# Patient Record
Sex: Male | Born: 1965 | Race: Black or African American | Hispanic: No | Marital: Single | Smoking: Never smoker
Health system: Southern US, Community
[De-identification: ages and names within clinical notes are randomized; demographics above are authoritative.]

## PROBLEM LIST (undated history)

## (undated) DIAGNOSIS — F909 Attention-deficit hyperactivity disorder, unspecified type: Secondary | ICD-10-CM

## (undated) DIAGNOSIS — H269 Unspecified cataract: Secondary | ICD-10-CM

## (undated) DIAGNOSIS — K819 Cholecystitis, unspecified: Secondary | ICD-10-CM

## (undated) DIAGNOSIS — E7800 Pure hypercholesterolemia, unspecified: Secondary | ICD-10-CM

## (undated) DIAGNOSIS — F79 Unspecified intellectual disabilities: Secondary | ICD-10-CM

## (undated) DIAGNOSIS — H409 Unspecified glaucoma: Secondary | ICD-10-CM

## (undated) DIAGNOSIS — R569 Unspecified convulsions: Secondary | ICD-10-CM

---

## 1997-10-31 ENCOUNTER — Other Ambulatory Visit: Admission: RE | Admit: 1997-10-31 | Discharge: 1997-10-31 | Payer: Self-pay | Admitting: Internal Medicine

## 1997-12-28 ENCOUNTER — Other Ambulatory Visit: Admission: RE | Admit: 1997-12-28 | Discharge: 1997-12-28 | Payer: Self-pay | Admitting: Internal Medicine

## 1998-01-23 ENCOUNTER — Other Ambulatory Visit: Admission: RE | Admit: 1998-01-23 | Discharge: 1998-01-23 | Payer: Self-pay | Admitting: Internal Medicine

## 2002-01-18 ENCOUNTER — Emergency Department (HOSPITAL_COMMUNITY): Admission: EM | Admit: 2002-01-18 | Discharge: 2002-01-18 | Payer: Self-pay | Admitting: Emergency Medicine

## 2004-05-22 ENCOUNTER — Emergency Department (HOSPITAL_COMMUNITY): Admission: EM | Admit: 2004-05-22 | Discharge: 2004-05-22 | Payer: Self-pay | Admitting: Emergency Medicine

## 2008-05-03 ENCOUNTER — Emergency Department (HOSPITAL_BASED_OUTPATIENT_CLINIC_OR_DEPARTMENT_OTHER): Admission: EM | Admit: 2008-05-03 | Discharge: 2008-05-03 | Payer: Self-pay | Admitting: Emergency Medicine

## 2011-06-29 ENCOUNTER — Emergency Department (INDEPENDENT_AMBULATORY_CARE_PROVIDER_SITE_OTHER): Payer: Medicare Other

## 2011-06-29 ENCOUNTER — Emergency Department (HOSPITAL_BASED_OUTPATIENT_CLINIC_OR_DEPARTMENT_OTHER)
Admission: EM | Admit: 2011-06-29 | Discharge: 2011-06-30 | Disposition: A | Discharge status: Home / Self Care | Payer: Medicare Other | Attending: Emergency Medicine | Admitting: Emergency Medicine

## 2011-06-29 DIAGNOSIS — R509 Fever, unspecified: Secondary | ICD-10-CM

## 2011-06-29 DIAGNOSIS — E119 Type 2 diabetes mellitus without complications: Secondary | ICD-10-CM | POA: Insufficient documentation

## 2011-06-29 DIAGNOSIS — R0989 Other specified symptoms and signs involving the circulatory and respiratory systems: Secondary | ICD-10-CM

## 2011-06-29 DIAGNOSIS — Z20828 Contact with and (suspected) exposure to other viral communicable diseases: Secondary | ICD-10-CM | POA: Insufficient documentation

## 2011-06-29 DIAGNOSIS — R51 Headache: Secondary | ICD-10-CM | POA: Insufficient documentation

## 2011-06-29 DIAGNOSIS — F79 Unspecified intellectual disabilities: Secondary | ICD-10-CM | POA: Insufficient documentation

## 2011-06-29 DIAGNOSIS — R05 Cough: Secondary | ICD-10-CM

## 2011-06-29 HISTORY — DX: Unspecified convulsions: R56.9

## 2011-06-29 HISTORY — DX: Unspecified intellectual disabilities: F79

## 2011-06-29 LAB — DIFFERENTIAL
Eosinophils Absolute: 0 10*3/uL (ref 0.0–0.7)
Eosinophils Relative: 0 % (ref 0–5)
Lymphocytes Relative: 17 % (ref 12–46)
Lymphs Abs: 1.3 10*3/uL (ref 0.7–4.0)
Monocytes Absolute: 1.7 10*3/uL — ABNORMAL HIGH (ref 0.1–1.0)
Monocytes Relative: 23 % — ABNORMAL HIGH (ref 3–12)
Neutro Abs: 4.3 10*3/uL (ref 1.7–7.7)
Neutrophils Relative %: 59 % (ref 43–77)

## 2011-06-29 LAB — CBC
HCT: 37.7 % — ABNORMAL LOW (ref 39.0–52.0)
Hemoglobin: 12.6 g/dL — ABNORMAL LOW (ref 13.0–17.0)
Platelets: 149 10*3/uL — ABNORMAL LOW (ref 150–400)
RDW: 12.8 % (ref 11.5–15.5)
WBC: 7.3 10*3/uL (ref 4.0–10.5)

## 2011-06-29 LAB — GLUCOSE, CAPILLARY: Glucose-Capillary: 69 mg/dL — ABNORMAL LOW (ref 70–99)

## 2011-06-29 MED ORDER — ACETAMINOPHEN 325 MG PO TABS: 650.0000 mg | ORAL_TABLET | Freq: Once | ORAL | Status: DC

## 2011-06-29 MED ORDER — SODIUM CHLORIDE 0.9 % IV BOLUS (SEPSIS)
1000.0000 mL | Freq: Once | INTRAVENOUS | Status: AC
  Administered 2011-06-29: 1000 mL via INTRAVENOUS

## 2011-06-29 MED ORDER — OSELTAMIVIR PHOSPHATE 75 MG PO CAPS
75.0000 mg | ORAL_CAPSULE | Freq: Once | ORAL | Status: AC
  Administered 2011-06-29: 75 mg via ORAL
  Filled 2011-06-29: qty 1

## 2011-06-29 NOTE — ED Provider Notes (Signed)
History  This chart was scribed for Omar Numbers, MD by Bennett Scrape. This patient was seen in room [facility]CT3/[facility]3 and the patient's care was started at 10:40PM.  CSN: [id number] Arrival date & time: 06/29/2011  9:05 PM   First MD Initiated Contact with Patient 06/29/11 2219      Chief Complaint  Patient presents with  . Fever    102.4  . Headache    The history is provided by a caregiver and the patient. No language interpreter was used.    Omar White is a 45 y.o. male brought in by caregiver from group home to the Emergency Department complaining of fever to 102, non-productive cough and mild headache. 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 Fever measured at 97.3 in the ED. Pt is from group home and is experiencing similar symptoms to other patients in the home.  One other resident is in the ED with fever as well while 2 of the residents at the home are already being treated with tamiflu. Caregiver states that he was given robitussin and tylenol for symptoms around 5:45PM with no improvement. He is a diabetic, but caregiver states that his sugars have been in a normal range. Caregiver confirms sick contacts at nursing home and reports two other residents being on tamiflu. Patient has normal SBP in the 90s per 2 caregivers' reports.   Past Medical History  Diagnosis Date  . MR (mental retardation)   . Seizures   . Diabetes mellitus     History reviewed. No pertinent past surgical history.  History reviewed. No pertinent family history.  History  Substance Use Topics  . Smoking status: Not on file  . Smokeless tobacco: Not on file  . Alcohol Use:       Review of Systems  Constitutional: Positive for fever. Negative for chills.  HENT: Negative for ear pain, sore throat and neck pain.   Eyes: Negative for pain and redness.  Respiratory: Positive for cough. Negative for shortness of breath.   Gastrointestinal: Negative for nausea, vomiting, abdominal pain  and diarrhea.  Genitourinary: Negative for dysuria, frequency and hematuria.  Musculoskeletal: Negative for back pain and joint swelling.  Skin: Negative for rash.  Neurological: Positive for headaches. Negative for dizziness and light-headedness.  All other systems reviewed and are negative.    Allergies  Shellfish allergy  Home Medications  No current outpatient prescriptions on file.  Triage Vitals: BP 102/61  Pulse 93  Temp(Src) 97.3 F (36.3 C) (Oral)  Resp 20  Wt 140 lb (63.504 kg)  SpO2 98%  Physical Exam  Nursing note and vitals reviewed. Constitutional: He is oriented to person, place, and time. He appears well-developed and well-nourished.       Uncomfortable appearing  HENT:  Head: Normocephalic and atraumatic.  Eyes: Conjunctivae and EOM are normal. Pupils are equal, round, and reactive to light.  Neck: Neck supple. No tracheal deviation present.  Cardiovascular: Normal rate, regular rhythm and normal heart sounds.  Exam reveals no gallop and no friction rub.   No murmur heard. Pulmonary/Chest: Effort normal and breath sounds normal. No respiratory distress. He has no wheezes. He has no rales.  Abdominal: Soft. Bowel sounds are normal. He exhibits no distension. There is no tenderness. There is no rebound and no guarding.  Musculoskeletal: Normal range of motion. He exhibits no edema and no tenderness.  Neurological: He is alert and oriented to person, place, and time. No cranial nerve deficit. He exhibits normal muscle  tone. Coordination normal.  Skin: Skin is warm and dry. No rash noted.  Psychiatric: He has a normal mood and affect.    ED Course  Procedures (including critical care time)  DIAGNOSTIC STUDIES: Oxygen Saturation is 98% on room air, normal by my interpretation.    COORDINATION OF CARE: 10:47PM-Discussed influenza swab, full blood work-up and tamiflu treatment with caregiver at bedside and caregiver agreed to plan. 11:25PM-Blood sugar was  69. Gave pt juice which he eagerly drank. 11:57PM-Discussed negative pneumonia chest x-ray and high WBC with caregiver at bedside and caregiver acknolwedged findings. Fever is 101.1 and will treat with Tylenol.   Labs Reviewed  CBC - Abnormal; Notable for the following:    RBC 4.09 (*)    Hemoglobin 12.6 (*)    HCT 37.7 (*)    Platelets 149 (*)    All other components within normal limits  DIFFERENTIAL - Abnormal; Notable for the following:    Monocytes Relative 23 (*)    Monocytes Absolute 1.7 (*)    All other components within normal limits  COMPREHENSIVE METABOLIC PANEL - Abnormal; Notable for the following:    Glucose, Bld 110 (*)    BUN 25 (*)    Creatinine, Ser 1.70 (*)    GFR calc non Af Amer 46 (*)    GFR calc Af Amer 54 (*)    All other components within normal limits  GLUCOSE, CAPILLARY - Abnormal; Notable for the following:    Glucose-Capillary 69 (*)    All other components within normal limits  LACTIC ACID, PLASMA  CULTURE, BLOOD (ROUTINE X 2)  PROCALCITONIN  POCT CBG MONITORING  INFLUENZA PANEL BY PCR   Dg Chest 2 View  06/29/2011  *RADIOLOGY REPORT*  Clinical Data: Cough, fever and congestion.  CHEST - 2 VIEW  Comparison: None.  Findings: The lungs are well-aerated and clear.  There is no evidence of focal opacification, pleural effusion or pneumothorax. Relatively symmetric biapical pleural thickening is noted.  The heart is normal in size; the mediastinal contour is within normal limits.  No acute osseous abnormalities are seen.  IMPRESSION: No acute cardiopulmonary process seen.  Original Report Authenticated By: Tonia Ghent, M.D.     1. Fever   2. Exposure to influenza       MDM  Patient was evaluated and had significant risk factors for influenza. Patient had had cough as well as fever. Fingerstick glucose was 69. Patient was able to drink and drank several cups of juice. Patient also had testing for influenza performed given that he lives in a group  home. Chest x-ray showed no signs of pneumonia. Renal panel showed no anion gap, a normal glucose, normal liver function, and a BUN of 25 with a creatinine of 1.7. No old creatinine was available for the patient. Nursing facility saffron aware of what his baseline was. Given patient's age as well as his history of diabetes he. Well could have a baseline creatinine of 1.7. Either way the patient is somewhat dehydrated. He was given IV fluids. With this patient reported feeling better.  Lactate was not elevated. Blood cultures were ordered initially in addition to urinalysis and urine culture. Patient had no leukocytosis and no symptoms concerning for urinary tract infection. Given that there was no anion gap on the patient's renal panel urinalysis was felt clinically necessary today. I think his symptoms are likely related to the viral syndrome displayed by his fellow residents. Patient did have flu testing sent for public health purposes.  He was treated with Tamiflu here. Patient did spike a temperature while in the emergency department. At that time patient was given Tylenol and was reassessed prior to departure. This had resolved the patient was feeling better. His discharge blood pressure was in the 90s systolic but this is his baseline per 2 staff members.  The patient was discharged in good condition with Tamiflu prescription.      I personally performed the services described in this documentation, which was scribed in my presence. The recorded information has been reviewed and considered.    Omar Numbers, MD 06/30/11 [phone]

## 2011-06-29 NOTE — ED Notes (Signed)
Pt presents with fever, cough and headache.  Caregiver states that he was given robitussin and tylenol for symptoms.

## 2011-06-30 LAB — INFLUENZA PANEL BY PCR (TYPE A & B): H1N1 flu by pcr: NOT DETECTED

## 2011-06-30 LAB — COMPREHENSIVE METABOLIC PANEL
ALT: 13 U/L (ref 0–53)
Alkaline Phosphatase: 79 U/L (ref 39–117)
CO2: 27 mEq/L (ref 19–32)
Calcium: 9.8 mg/dL (ref 8.4–10.5)
Chloride: 100 mEq/L (ref 96–112)
GFR calc Af Amer: 54 mL/min — ABNORMAL LOW (ref >90)
Total Bilirubin: 0.6 mg/dL (ref 0.3–1.2)
Total Protein: 8 g/dL (ref 6.0–8.3)

## 2011-06-30 LAB — PROCALCITONIN: Procalcitonin: <0.1 ng/mL

## 2011-06-30 MED ORDER — SODIUM CHLORIDE 0.9 % IV BOLUS (SEPSIS): 1000.0000 mL | Freq: Once | INTRAVENOUS | Status: DC

## 2011-06-30 MED ORDER — OSELTAMIVIR PHOSPHATE 75 MG PO CAPS: 75.0000 mg | ORAL_CAPSULE | Freq: Two times a day (BID) | ORAL | Status: AC

## 2011-06-30 MED ORDER — ACETAMINOPHEN 160 MG/5ML PO SOLN
650.0000 mg | Freq: Once | ORAL | Status: AC
  Administered 2011-06-30: 650 mg via ORAL

## 2011-06-30 MED ORDER — ACETAMINOPHEN 160 MG/5ML PO SOLN
ORAL | Status: AC
  Administered 2011-06-30: 650 mg via ORAL
  Filled 2011-06-30: qty 20.3

## 2011-07-06 LAB — CULTURE, BLOOD (ROUTINE X 2)
Culture  Setup Time: 201212030205
Culture: NO GROWTH

## 2011-07-28 ENCOUNTER — Encounter (HOSPITAL_BASED_OUTPATIENT_CLINIC_OR_DEPARTMENT_OTHER): Payer: Self-pay | Admitting: Emergency Medicine

## 2011-07-28 ENCOUNTER — Emergency Department (HOSPITAL_BASED_OUTPATIENT_CLINIC_OR_DEPARTMENT_OTHER)
Admission: EM | Admit: 2011-07-28 | Discharge: 2011-07-28 | Payer: Medicare Other | Attending: Emergency Medicine | Admitting: Emergency Medicine

## 2011-07-28 DIAGNOSIS — N498 Inflammatory disorders of other specified male genital organs: Secondary | ICD-10-CM | POA: Insufficient documentation

## 2011-07-28 DIAGNOSIS — N492 Inflammatory disorders of scrotum: Secondary | ICD-10-CM

## 2011-07-28 DIAGNOSIS — E119 Type 2 diabetes mellitus without complications: Secondary | ICD-10-CM | POA: Insufficient documentation

## 2011-07-28 DIAGNOSIS — E7800 Pure hypercholesterolemia, unspecified: Secondary | ICD-10-CM | POA: Insufficient documentation

## 2011-07-28 DIAGNOSIS — Z79899 Other long term (current) drug therapy: Secondary | ICD-10-CM | POA: Insufficient documentation

## 2011-07-28 HISTORY — DX: Pure hypercholesterolemia, unspecified: E78.00

## 2011-07-28 MED ORDER — SULFAMETHOXAZOLE-TRIMETHOPRIM 800-160 MG PO TABS: 1.0000 | ORAL_TABLET | Freq: Two times a day (BID) | ORAL | Status: AC

## 2011-07-28 MED ORDER — CLOTRIMAZOLE 1 % EX CREA: TOPICAL_CREAM | CUTANEOUS | Status: AC

## 2011-07-28 NOTE — ED Provider Notes (Signed)
History     CSN: [id number]  Arrival date & time 07/28/11  1326   First MD Initiated Contact with Patient 07/28/11 1407      Chief Complaint  Patient presents with  . Penis Pain    (Consider location/radiation/quality/duration/timing/severity/associated sxs/prior treatment) Patient is a 45 y.o. male presenting with penile pain. The history is provided by the patient. No language interpreter was used.  Penis Pain This is a new problem. The current episode started today. The problem occurs constantly. The problem has been unchanged. Pertinent negatives include no abdominal pain, fever or urinary symptoms. The symptoms are aggravated by nothing. He has tried nothing for the symptoms.  Pt has sores to right side of scrotum.  No known injury.  No fever.    Past Medical History  Diagnosis Date  . MR (mental retardation)   . Seizures   . Diabetes mellitus   . Glaucoma   . High cholesterol     No past surgical history on file.  No family history on file.  History  Substance Use Topics  . Smoking status: Not on file  . Smokeless tobacco: Not on file  . Alcohol Use:       Review of Systems  Constitutional: Negative for fever.  Gastrointestinal: Negative for abdominal pain.  Genitourinary: Positive for testicular pain.  All other systems reviewed and are negative.    Allergies  Shellfish allergy  Home Medications   Current Outpatient Rx  Name Route Sig Dispense Refill  . ACETAMINOPHEN 325 MG PO TABS Oral Take 650 mg by mouth every 6 (six) hours as needed. For pain and fever     . ARIPIPRAZOLE 20 MG PO TABS Oral Take 20 mg by mouth daily.      . GUAIFENESIN 100 MG/5ML PO LIQD Oral Take 300 mg by mouth 3 (three) times daily as needed. For cough     . LACTULOSE 10 GM/15ML PO SOLN Oral Take 10 g by mouth daily.      Marland Kitchen LAMOTRIGINE 100 MG PO TABS Oral Take 300 mg by mouth daily.     Marland Kitchen LISINOPRIL 2.5 MG PO TABS Oral Take 2.5 mg by mouth daily.     Marland Kitchen LORAZEPAM 1 MG PO  TABS Oral Take 1 mg by mouth daily.      Marland Kitchen METFORMIN HCL 500 MG PO TABS Oral Take 500 mg by mouth 2 (two) times daily with a meal.      . PROPRANOLOL HCL 80 MG PO TABS Oral Take 160 mg by mouth 2 (two) times daily.     Marland Kitchen RISPERIDONE 2 MG PO TABS Oral Take 2 mg by mouth 2 (two) times daily.      Marland Kitchen SIMVASTATIN 40 MG PO TABS Oral Take 40 mg by mouth at bedtime.        BP 116/69  Pulse 75  Temp(Src) 97.5 F (36.4 C) (Oral)  Resp 20  SpO2 99%  Physical Exam  Nursing note and vitals reviewed. Constitutional: He is oriented to person, place, and time. He appears well-developed and well-nourished.  HENT:  Head: Normocephalic.  Right Ear: External ear normal.  Left Ear: External ear normal.  Mouth/Throat: Oropharynx is clear and moist.  Eyes: Pupils are equal, round, and reactive to light.  Neck: Normal range of motion.  Cardiovascular: Normal rate.   Pulmonary/Chest: Effort normal and breath sounds normal.  Abdominal: Soft. Bowel sounds are normal.  Genitourinary: Penis normal.       Right testicle,  Wounds  look like pt has scratched with nails,  Red streaks,  Yellow exudate,    Musculoskeletal: Normal range of motion.  Neurological: He is alert and oriented to person, place, and time.  Skin: Skin is warm.  Psychiatric: He has a normal mood and affect.    ED Course  Procedures (including critical care time)  Labs Reviewed - No data to display No results found.   No diagnosis found.    MDM  I suspect pt has jock itch and has dug at scrotum with fingernails until he has torn skin.  I will treat with lotrimin and bactrim.   I advised pt needs to see Md for recheck in 2-3 days.  Group home staff advised to watch closely for worsening infection        Langston Masker, [place] 07/28/11 1432

## 2011-07-28 NOTE — ED Notes (Signed)
Supervisor from group home with pt reports staff noticed penile irritation on pt this am during bath

## 2011-07-29 NOTE — ED Provider Notes (Signed)
Medical screening examination/treatment/procedure(s) were performed by non-physician practitioner and as supervising physician I was immediately available for consultation/collaboration.   Geoffery Lyons, MD 07/29/11 [phone]

## 2013-06-15 ENCOUNTER — Ambulatory Visit: Payer: Self-pay | Admitting: Podiatry

## 2013-07-04 ENCOUNTER — Ambulatory Visit: Payer: Self-pay | Admitting: Podiatry

## 2013-07-18 ENCOUNTER — Ambulatory Visit (INDEPENDENT_AMBULATORY_CARE_PROVIDER_SITE_OTHER): Payer: Medicare Other | Admitting: Podiatry

## 2013-07-18 ENCOUNTER — Encounter: Payer: Self-pay | Admitting: Podiatry

## 2013-07-18 VITALS — BP 120/77 | HR 57 | Resp 14

## 2013-07-18 DIAGNOSIS — B351 Tinea unguium: Secondary | ICD-10-CM

## 2013-07-18 DIAGNOSIS — M79609 Pain in unspecified limb: Secondary | ICD-10-CM

## 2013-07-18 NOTE — Progress Notes (Signed)
Subjective:     Patient ID: Omar White, male   DOB: 1965-08-30, 47 y.o.   MRN: [id number]  HPI patient presents with caregiver with thick nail disease 1-5 both feet that are painful and impossible for him to take care of   Review of Systems     Objective:   Physical Exam Neurovascular status unchanged with nail disease 1-5 both feet that are thick and painful    Assessment:     Mycotic nail infection with pain 1-5 both feet    Plan:     Debridement of painful nailbeds 1-5 both feet with no bleeding noted

## 2014-12-12 ENCOUNTER — Ambulatory Visit (INDEPENDENT_AMBULATORY_CARE_PROVIDER_SITE_OTHER): Payer: Medicare Other | Admitting: Podiatry

## 2014-12-12 DIAGNOSIS — B351 Tinea unguium: Secondary | ICD-10-CM | POA: Diagnosis not present

## 2014-12-12 DIAGNOSIS — M79673 Pain in unspecified foot: Secondary | ICD-10-CM

## 2014-12-12 NOTE — Progress Notes (Signed)
   Subjective:    Patient ID: Omar PollockRoger Paul White, male    DOB: 09/03/1965, 49 y.o.   MRN: [id number]  HPI Comments: Pt presents with his caregiver for debridement of 10 toenails by Dr. Helane GuntherGregory Mayer.     Review of Systems HPI Presents today chief complaint of painful elongated toenails.  Objective: Pulses are palpable bilateral nails are thick, yellow dystrophic onychomycosis and painful palpation.   Assessment: Onychomycosis with pain in limb.  Plan: Treatment of nails in thickness and length as covered service secondary to pain.     Objective:   Physical Exam        Assessment & Plan:

## 2015-03-13 ENCOUNTER — Encounter: Payer: Self-pay | Admitting: Podiatry

## 2015-03-13 ENCOUNTER — Ambulatory Visit (INDEPENDENT_AMBULATORY_CARE_PROVIDER_SITE_OTHER): Payer: Medicare Other | Admitting: Podiatry

## 2015-03-13 DIAGNOSIS — M79673 Pain in unspecified foot: Secondary | ICD-10-CM | POA: Diagnosis not present

## 2015-03-13 DIAGNOSIS — B351 Tinea unguium: Secondary | ICD-10-CM

## 2015-03-13 NOTE — Progress Notes (Signed)
Patient ID: Omar White, male   DOB: [birth year], 49 y.o.   MRN: [id number] Complaint:  Visit Type: Patient returns to my office for continued preventative foot care services. Complaint: Patient states" my nails have grown long and thick and become painful to walk and wear shoes" Patient has been diagnosed with DM with no foot complications. The patient presents for preventative foot care services. No changes to ROS  Podiatric Exam: Vascular: dorsalis pedis and posterior tibial pulses are palpable bilateral. Capillary return is immediate. Temperature gradient is WNL. Skin turgor WNL  Sensorium: Normal Semmes Weinstein monofilament test. Normal tactile sensation bilaterally. Nail Exam: Pt has thick disfigured discolored nails with subungual debris noted bilateral entire nail hallux through fifth toenails Ulcer Exam: There is no evidence of ulcer or pre-ulcerative changes or infection. Orthopedic Exam: Muscle tone and strength are WNL. No limitations in general ROM. No crepitus or effusions noted. Foot type and digits show no abnormalities. Bony prominences are unremarkable. Skin: No Porokeratosis. No infection or ulcers  Diagnosis:  Onychomycosis, , Pain in right toe, pain in left toes  Treatment & Plan Procedures and Treatment: Consent by patient was obtained for treatment procedures. The patient understood the discussion of treatment and procedures well. All questions were answered thoroughly reviewed. Debridement of mycotic and hypertrophic toenails, 1 through 5 bilateral and clearing of subungual debris. No ulceration, no infection noted.  Return Visit-Office Procedure: Patient instructed to return to the office for a follow up visit 3 months for continued evaluation and treatment.

## 2015-06-26 ENCOUNTER — Encounter: Payer: Self-pay | Admitting: Podiatry

## 2015-06-26 ENCOUNTER — Ambulatory Visit (INDEPENDENT_AMBULATORY_CARE_PROVIDER_SITE_OTHER): Payer: Medicare Other | Admitting: Podiatry

## 2015-06-26 DIAGNOSIS — B351 Tinea unguium: Secondary | ICD-10-CM | POA: Diagnosis not present

## 2015-06-26 DIAGNOSIS — M79673 Pain in unspecified foot: Secondary | ICD-10-CM

## 2015-06-26 NOTE — Progress Notes (Signed)
Patient ID: Omar PollockRoger Paul White, male   DOB: [birth year], 49 y.o.   MRN: [id number] Complaint:  Visit Type: Patient returns to my office for continued preventative foot care services. Complaint: Patient states" my nails have grown long and thick and become painful to walk and wear shoes" Patient has been diagnosed with DM with no foot complications. The patient presents for preventative foot care services. No changes to ROS  Podiatric Exam: Vascular: dorsalis pedis and posterior tibial pulses are palpable bilateral. Capillary return is immediate. Temperature gradient is WNL. Skin turgor WNL  Sensorium: Normal Semmes Weinstein monofilament test. Normal tactile sensation bilaterally. Nail Exam: Pt has thick disfigured discolored nails with subungual debris noted bilateral entire nail hallux through fifth toenails Ulcer Exam: There is no evidence of ulcer or pre-ulcerative changes or infection. Orthopedic Exam: Muscle tone and strength are WNL. No limitations in general ROM. No crepitus or effusions noted. Foot type and digits show no abnormalities. Bony prominences are unremarkable. Skin: No Porokeratosis. No infection or ulcers  Diagnosis:  Onychomycosis, , Pain in right toe, pain in left toes  Treatment & Plan Procedures and Treatment: Consent by patient was obtained for treatment procedures. The patient understood the discussion of treatment and procedures well. All questions were answered thoroughly reviewed. Debridement of mycotic and hypertrophic toenails, 1 through 5 bilateral and clearing of subungual debris. No ulceration, no infection noted.  Return Visit-Office Procedure: Patient instructed to return to the office for a follow up visit 3 months for continued evaluation and treatment.  Helane GuntherGregory Radin Raptis DPM

## 2015-10-02 ENCOUNTER — Ambulatory Visit: Payer: Medicare Other | Admitting: Podiatry

## 2015-10-05 ENCOUNTER — Ambulatory Visit: Payer: Medicare Other | Admitting: Podiatry

## 2016-09-22 ENCOUNTER — Emergency Department (HOSPITAL_COMMUNITY): Payer: Medicare Other

## 2016-09-22 ENCOUNTER — Encounter (HOSPITAL_COMMUNITY): Payer: Self-pay | Admitting: Emergency Medicine

## 2016-09-22 ENCOUNTER — Emergency Department (HOSPITAL_COMMUNITY)
Admission: EM | Admit: 2016-09-22 | Discharge: 2016-09-22 | Disposition: A | Discharge status: Home / Self Care | Payer: Medicare Other | Attending: Emergency Medicine | Admitting: Emergency Medicine

## 2016-09-22 DIAGNOSIS — E119 Type 2 diabetes mellitus without complications: Secondary | ICD-10-CM | POA: Insufficient documentation

## 2016-09-22 DIAGNOSIS — K5900 Constipation, unspecified: Secondary | ICD-10-CM | POA: Diagnosis not present

## 2016-09-22 DIAGNOSIS — Z7984 Long term (current) use of oral hypoglycemic drugs: Secondary | ICD-10-CM | POA: Diagnosis not present

## 2016-09-22 DIAGNOSIS — R1013 Epigastric pain: Secondary | ICD-10-CM | POA: Diagnosis present

## 2016-09-22 LAB — COMPREHENSIVE METABOLIC PANEL WITH GFR
ALT: 28 U/L (ref 17–63)
AST: 43 U/L — ABNORMAL HIGH (ref 15–41)
Albumin: 4.8 g/dL (ref 3.5–5.0)
Alkaline Phosphatase: 97 U/L (ref 38–126)
Anion gap: 7 (ref 5–15)
BUN: 28 mg/dL — ABNORMAL HIGH (ref 6–20)
CO2: 30 mmol/L (ref 22–32)
Calcium: 10.7 mg/dL — ABNORMAL HIGH (ref 8.9–10.3)
Chloride: 100 mmol/L — ABNORMAL LOW (ref 101–111)
Creatinine, Ser: 1.62 mg/dL — ABNORMAL HIGH (ref 0.61–1.24)
GFR calc Af Amer: 55 mL/min — ABNORMAL LOW
GFR calc non Af Amer: 47 mL/min — ABNORMAL LOW
Glucose, Bld: 103 mg/dL — ABNORMAL HIGH (ref 65–99)
Potassium: 4.6 mmol/L (ref 3.5–5.1)
Sodium: 137 mmol/L (ref 135–145)
Total Bilirubin: 0.5 mg/dL (ref 0.3–1.2)
Total Protein: 9 g/dL — ABNORMAL HIGH (ref 6.5–8.1)

## 2016-09-22 LAB — CBC
HCT: 37.7 % — ABNORMAL LOW (ref 39.0–52.0)
HEMOGLOBIN: 12.9 g/dL — AB (ref 13.0–17.0)
MCH: 31 pg (ref 26.0–34.0)
MCHC: 34.2 g/dL (ref 30.0–36.0)
MCV: 90.6 fL (ref 78.0–100.0)
Platelets: 224 10*3/uL (ref 150–400)
RBC: 4.16 MIL/uL — ABNORMAL LOW (ref 4.22–5.81)
RDW: 12.8 % (ref 11.5–15.5)
WBC: 5.9 10*3/uL (ref 4.0–10.5)

## 2016-09-22 LAB — I-STAT TROPONIN, ED: Troponin i, poc: 0 ng/mL (ref 0.00–0.08)

## 2016-09-22 LAB — LIPASE, BLOOD: Lipase: 25 U/L (ref 11–51)

## 2016-09-22 MED ORDER — ONDANSETRON 4 MG PO TBDP
4.0000 mg | ORAL_TABLET | Freq: Once | ORAL | Status: AC
  Administered 2016-09-22: 4 mg via ORAL
  Filled 2016-09-22: qty 1

## 2016-09-22 MED ORDER — SODIUM CHLORIDE 0.9 % IV BOLUS (SEPSIS)
1000.0000 mL | Freq: Once | INTRAVENOUS | Status: AC
  Administered 2016-09-22: 1000 mL via INTRAVENOUS

## 2016-09-22 MED ORDER — MILK AND MOLASSES ENEMA
1.0000 | Freq: Once | RECTAL | Status: AC
  Administered 2016-09-22: 250 mL via RECTAL
  Filled 2016-09-22: qty 250

## 2016-09-22 MED ORDER — IOPAMIDOL (ISOVUE-300) INJECTION 61%
100.0000 mL | Freq: Once | INTRAVENOUS | Status: AC | PRN
  Administered 2016-09-22: 75 mL via INTRAVENOUS

## 2016-09-22 MED ORDER — IOPAMIDOL (ISOVUE-300) INJECTION 61%
INTRAVENOUS | Status: AC
  Filled 2016-09-22: qty 100

## 2016-09-22 NOTE — ED Notes (Signed)
Pt tolerated crackers PO 

## 2016-09-22 NOTE — ED Triage Notes (Signed)
Pt began having n/v/d last night. Nausea continues today. When asked about pain, pt reports he has CP and upper back pain. No SOB.

## 2016-09-22 NOTE — Discharge Instructions (Signed)
One cap of Miralax mixed in a glass of water daily until bowel movements are regular. An over the counter stool softener such as Colace can also be used.  Follow up with your primary physician in regard's to today's visit. Return to ER for persistent vomiting, new or worsening symptoms, any additional concerns.   GETTING TO GOOD BOWEL HEALTH.     The goal: ONE SOFT BOWEL MOVEMENT A DAY!  To have soft, regular bowel movements:  Drink at least 8 tall glasses of water a day.   Take plenty of fiber.  Fiber is the undigested part of plant food that passes into the colon, acting s ?natures broom? to encourage bowel motility and movement.  Fiber can absorb and hold large amounts of water. This results in a larger, bulkier stool, which is soft and easier to pass. Work gradually over several weeks up to 6 servings a day of fiber (25g a day even more if needed) in the form of: Vegetables -- Root (potatoes, carrots, turnips), leafy green (lettuce, salad greens, celery, spinach), or cooked high residue (cabbage, broccoli, etc) Fruit -- Fresh (unpeeled skin & pulp), Dried (prunes, apricots, cherries, etc ),  or stewed ( applesauce)  Whole grain breads, pasta, etc (whole wheat)  Bran cereals

## 2016-09-22 NOTE — ED Provider Notes (Signed)
[facility]-EMERGENCY DEPT Provider Note   CSN: [id number] Arrival date & time: 09/22/16  1536     History   Chief Complaint Chief Complaint  Omar White presents with  . Emesis  . Nausea  . Chest Pain    HPI Omar White is a 51 y.o. male.  The history is provided by the Omar White, medical records and a caregiver. No language interpreter was used.  Emesis   Pertinent negatives include no fever.  Chest Pain   Associated symptoms include vomiting. Pertinent negatives include no fever.   Omar White is a 51 y.o. male  with a PMH of MR, DM, HLD, seizures who presents to the Emergency Department with caregiver for nausea and not acting himself last night. Per caregiver, Omar White had 3 episodes of emesis today and does not want to eat or drink as usual. No diarrhea. Omar White unsure of last BM. Caregiver states that she was not with him over the weekend but know that Omar White didn't have a BM today. No fevers/chills. When asked where pain is, Omar White will point to epigastrium and say "my chest".   Level V caveat applies 2/2 mental retardation.   Past Medical History:  Diagnosis Date  . Diabetes mellitus   . Glaucoma   . High cholesterol   . MR (mental retardation)   . Seizures (HCC)     There are no active problems to display for this Omar White.   History reviewed. No pertinent surgical history.     Home Medications    Prior to Admission medications   Medication Sig Start Date End Date Taking? Authorizing Provider  ARIPiprazole (ABILIFY) 15 MG tablet Take 15 mg by mouth daily. Daily in morning   Yes Historical Provider, MD  benztropine (COGENTIN) 1 MG tablet Take 1 mg by mouth 2 (two) times daily.   Yes Historical Provider, MD  clotrimazole (LOTRIMIN) 1 % cream Apply 1 application topically 2 (two) times daily.   Yes Historical Provider, MD  docusate sodium (DOCQLACE) 100 MG capsule Take 100 mg by mouth 2 (two) times daily.   Yes Historical Provider, MD  Emollient (LUBRISKIN EX) Apply  topically.   Yes Historical Provider, MD  guaiFENesin (ROBITUSSIN) 100 MG/5ML liquid Take 300 mg by mouth 3 (three) times daily as needed. For cough    Yes Historical Provider, MD  ketoconazole (NIZORAL) 2 % cream Apply 1 application topically daily.   Yes Historical Provider, MD  lactulose (CHRONULAC) 10 GM/15ML solution Take 10 g by mouth daily.     Yes Historical Provider, MD  lamoTRIgine (LAMICTAL) 100 MG tablet Take 100 mg by mouth 2 (two) times daily.    Yes Historical Provider, MD  levOCARNitine (CARNITOR) 330 MG tablet Take 330 mg by mouth 3 (three) times daily.   Yes Historical Provider, MD  lisinopril (PRINIVIL,ZESTRIL) 2.5 MG tablet Take 2.5 mg by mouth daily.    Yes Historical Provider, MD  LORazepam (ATIVAN) 1 MG tablet Take 1 mg by mouth daily.     Yes Historical Provider, MD  metFORMIN (GLUCOPHAGE) 500 MG tablet Take 500 mg by mouth 2 (two) times daily with a meal.     Yes Historical Provider, MD  mupirocin ointment (BACTROBAN) 2 % Place 1 application into the nose 2 (two) times daily.   Yes Historical Provider, MD  propranolol (INDERAL) 60 MG tablet Take 60 mg by mouth daily. mornings   Yes Historical Provider, MD  propranolol (INDERAL) 80 MG tablet Take 80 mg by mouth at bedtime.  Yes Historical Provider, MD  risperiDONE (RISPERDAL) 2 MG tablet Take 2 mg by mouth 2 (two) times daily.     Yes Historical Provider, MD  simvastatin (ZOCOR) 20 MG tablet Take 20 mg by mouth daily.   Yes Historical Provider, MD  simvastatin (ZOCOR) 40 MG tablet Take 40 mg by mouth at bedtime.     Yes Historical Provider, MD    Family History History reviewed. No pertinent family history.  Social History Social History  Substance Use Topics  . Smoking status: Unknown If Ever Smoked  . Smokeless tobacco: Not on file  . Alcohol use Not on file     Allergies   Shellfish allergy   Review of Systems Review of Systems  Unable to perform ROS: Other (MR)  Constitutional: Negative for fever.    Gastrointestinal: Positive for vomiting.     Physical Exam Updated Vital Signs BP 118/79 (BP Location: Right Arm)   Pulse 79   Temp 97 F (36.1 C) (Axillary)   Resp 18   SpO2 100%   Physical Exam  Constitutional: Omar White is oriented to person, place, and time. Omar White appears well-developed and well-nourished. No distress.  Non-toxic appearing.  HENT:  Head: Normocephalic and atraumatic.  Cardiovascular: Normal rate, regular rhythm and normal heart sounds.   No murmur heard. Pulmonary/Chest: Effort normal and breath sounds normal. No respiratory distress. Omar White has no wheezes. Omar White has no rales.  Abdominal: Soft. Bowel sounds are normal. Omar White exhibits no distension.  Generalized tenderness to palpation with no focality. No rebound or guarding.   Musculoskeletal:  Moves all extremities well.   Neurological: Omar White is alert and oriented to person, place, and time.  Skin: Skin is warm and dry.  Nursing note and vitals reviewed.    ED Treatments / Results  Labs (all labs ordered are listed, but only abnormal results are displayed) Labs Reviewed  COMPREHENSIVE METABOLIC PANEL - Abnormal; Notable for the following:       Result Value   Chloride 100 (*)    Glucose, Bld 103 (*)    BUN 28 (*)    Creatinine, Ser 1.62 (*)    Calcium 10.7 (*)    Total Protein 9.0 (*)    AST 43 (*)    GFR calc non Af Amer 47 (*)    GFR calc Af Amer 55 (*)    All other components within normal limits  CBC - Abnormal; Notable for the following:    RBC 4.16 (*)    Hemoglobin 12.9 (*)    HCT 37.7 (*)    All other components within normal limits  LIPASE, BLOOD  URINALYSIS, ROUTINE W REFLEX MICROSCOPIC  I-STAT TROPOININ, ED    EKG  EKG Interpretation  Date/Time:  Monday September 22 2016 16:09:49 EST Ventricular Rate:  72 PR Interval:    QRS Duration: 80 QT Interval:  336 QTC Calculation: 368 R Axis:   54 Text Interpretation:  Sinus rhythm Probable left atrial enlargement RSR' in V1 or V2, probably normal  variant ST elev, probable normal early repol pattern Baseline wander in lead(s) V3 No old tracing to compare Confirmed by [facility] MD, JULIE [phone]) on 09/22/2016 7:33:54 PM       Radiology Dg Chest 2 View  Result Date: 09/22/2016 CLINICAL DATA:  Vomiting and chest pain for the past day. History of diabetes, seizure activity, hyperlipidemia. EXAM: CHEST  2 VIEW COMPARISON:  Chest x-ray of June 29, 2011 FINDINGS: The lungs are adequately inflated and clear. The heart  and pulmonary vascularity are normal. The mediastinum is normal in width. There is no pleural effusion. The bony thorax exhibits no acute abnormality. The bowel gas pattern in the upper abdomen is unremarkable. IMPRESSION: There is no pneumonia nor other acute cardiopulmonary abnormality. Electronically Signed   By: David  [place] M.D.   On: 09/22/2016 16:27   Ct Abdomen Pelvis W Contrast  Result Date: 09/22/2016 CLINICAL DATA:  Abdominal pain.  Nausea, vomiting and diarrhea. EXAM: CT ABDOMEN AND PELVIS WITH CONTRAST TECHNIQUE: Multidetector CT imaging of the abdomen and pelvis was performed using the standard protocol following bolus administration of intravenous contrast. CONTRAST:  75mL ISOVUE-300 IOPAMIDOL (ISOVUE-300) INJECTION 61% COMPARISON:  None. FINDINGS: Lower chest: Trace left pleural effusion. Minimal dependent atelectasis. Heart is normal in size. Hepatobiliary: Tiny subcentimeter hypodensity in the right lobe of the liver is too small to characterize. Calcified gallstones within primarily decompressed gallbladder. Gallstone also noted in the gallbladder neck. No gallbladder wall thickening or pericholecystic inflammation. No biliary dilatation. Pancreas: No ductal dilatation or inflammation. Spleen: Normal in size without focal abnormality. Adrenals/Urinary Tract: Normal adrenal glands. Symmetric renal enhancement and excretion on delayed phase imaging. There is a 2.4 cm simple cyst in the mid right kidney. Urinary bladder is  physiologically distended without wall thickening. Stomach/Bowel: Large stool burden in the transverse, descending, and sigmoid colon with a stool ball distending the rectum. Sigmoid colonic tortuosity. Cecum and ascending colon are prominent fluid-filled. No colonic inflammation. Fluid-filled small bowel in the right lower abdomen. The appendix is not confidently visualized. There is no pericecal or right lower quadrant inflammation to suggest appendicitis. No evidence of obstruction. Stomach is decompressed and not well evaluated. Vascular/Lymphatic: No significant vascular findings are present. Accessory renal artery on the left, incidentally noted. No enlarged abdominal or pelvic lymph nodes. Reproductive: Prostate is unremarkable. Other: No free air, free fluid, or intra-abdominal fluid collection. Musculoskeletal: There are no acute or suspicious osseous abnormalities. Degenerative spurring in the spine. IMPRESSION: 1. Large colonic stool burden suggesting constipation, with stool distending the rectum. Fluid-filled distal small bowel and ascending colon, pattern consistent with ileus or less likely enteritis. No evidence of bowel obstruction. 2. Cholelithiasis with gallstone in the gallbladder neck. No gallbladder inflammation or abnormal distention. 3. Trace left pleural effusion. Electronically Signed   By: Rubye Oaks M.D.   On: 09/22/2016 20:47    Procedures Procedures (including critical care time)  Medications Ordered in ED Medications  iopamidol (ISOVUE-300) 61 % injection (not administered)  ondansetron (ZOFRAN-ODT) disintegrating tablet 4 mg (4 mg Oral Given 09/22/16 1926)  sodium chloride 0.9 % bolus 1,000 mL (1,000 mLs Intravenous New Bag/Given 09/22/16 2012)  iopamidol (ISOVUE-300) 61 % injection 100 mL (75 mLs Intravenous Contrast Given 09/22/16 2020)  milk and molasses enema (250 mLs Rectal Given 09/22/16 2224)     Initial Impression / Assessment and Plan / ED Course  I have  reviewed the triage vital signs and the nursing notes.  Pertinent labs & imaging results that were available during my care of the Omar White were reviewed by me and considered in my medical decision making (see chart for details).    Wentworth Yuvin Bussiere is a 51 y.o. male with hx of MR who presents to ED for n/v and abdominal pain. When asked where pain is, Omar White will point to abdomen, but say his chest hurts. Troponin, cxr and EKG were obtained to evaluate for possible chest pain all of which were reassuring. Labs at baseline. CT abd/pelvis obtained which shows:  IMPRESSION: 1. Large colonic stool burden suggesting constipation, with stool distending the rectum. Fluid-filled distal small bowel and ascending colon, pattern consistent with ileus or less likely enteritis. No evidence of bowel obstruction. 2. Cholelithiasis with gallstone in the gallbladder neck. No gallbladder inflammation or abnormal distention. 3. Trace left pleural effusion.  Enema given which produced very large bowel movement. On repeat evaluation, Omar White appears to feel improved. Abdominal exam improved. No emesis while in ED today and tolerating soda and crackers without difficulty. Discussed increase hydration, miralax regimen and PCP follow up for recheck with caregiver who is agreeable with plan and feels comfortable with discharge to home.   Omar White discussed with Dr. Particia NearingHaviland who agrees with treatment plan.    Final Clinical Impressions(s) / ED Diagnoses   Final diagnoses:  Constipation, unspecified constipation type    New Prescriptions New Prescriptions   No medications on file     [facility] LLCJaime Pilcher Ward, PA-C 09/22/16 2333    Jacalyn LefevreJulie Haviland, MD 09/23/16 [phone]

## 2017-06-13 ENCOUNTER — Other Ambulatory Visit: Payer: Self-pay

## 2017-06-13 ENCOUNTER — Emergency Department (HOSPITAL_COMMUNITY)
Admission: EM | Admit: 2017-06-13 | Discharge: 2017-06-13 | Disposition: A | Discharge status: Home / Self Care | Payer: Medicare Other | Attending: Emergency Medicine | Admitting: Emergency Medicine

## 2017-06-13 ENCOUNTER — Emergency Department (HOSPITAL_COMMUNITY): Payer: Medicare Other

## 2017-06-13 ENCOUNTER — Encounter (HOSPITAL_COMMUNITY): Payer: Self-pay | Admitting: Emergency Medicine

## 2017-06-13 DIAGNOSIS — Z79899 Other long term (current) drug therapy: Secondary | ICD-10-CM | POA: Insufficient documentation

## 2017-06-13 DIAGNOSIS — S59911A Unspecified injury of right forearm, initial encounter: Secondary | ICD-10-CM | POA: Diagnosis present

## 2017-06-13 DIAGNOSIS — Z7984 Long term (current) use of oral hypoglycemic drugs: Secondary | ICD-10-CM | POA: Diagnosis not present

## 2017-06-13 DIAGNOSIS — W2209XA Striking against other stationary object, initial encounter: Secondary | ICD-10-CM | POA: Diagnosis not present

## 2017-06-13 DIAGNOSIS — S52291A Other fracture of shaft of right ulna, initial encounter for closed fracture: Secondary | ICD-10-CM | POA: Diagnosis not present

## 2017-06-13 DIAGNOSIS — Y9389 Activity, other specified: Secondary | ICD-10-CM | POA: Diagnosis not present

## 2017-06-13 DIAGNOSIS — Y998 Other external cause status: Secondary | ICD-10-CM | POA: Diagnosis not present

## 2017-06-13 DIAGNOSIS — E119 Type 2 diabetes mellitus without complications: Secondary | ICD-10-CM | POA: Insufficient documentation

## 2017-06-13 DIAGNOSIS — F79 Unspecified intellectual disabilities: Secondary | ICD-10-CM | POA: Insufficient documentation

## 2017-06-13 DIAGNOSIS — Y9289 Other specified places as the place of occurrence of the external cause: Secondary | ICD-10-CM | POA: Insufficient documentation
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
## 2017-06-13 NOTE — Discharge Instructions (Signed)
It was my pleasure taking care of you today!   You should receive a call from the hand surgeon's office this week to arrange follow up. If you do not receive a call by Wednesday, please call the office to schedule an appointment.   Do not get splint wet.   Tylenol as needed for pain.   Return to ER for new or worsening symptoms, any additional concerns.
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
## 2018-09-10 ENCOUNTER — Emergency Department (HOSPITAL_COMMUNITY)
Admission: EM | Admit: 2018-09-10 | Discharge: 2018-09-11 | Disposition: A | Discharge status: Home / Self Care | Payer: Medicare Other | Attending: Emergency Medicine | Admitting: Emergency Medicine

## 2018-09-10 ENCOUNTER — Encounter (HOSPITAL_COMMUNITY): Payer: Self-pay

## 2018-09-10 ENCOUNTER — Emergency Department (HOSPITAL_COMMUNITY): Payer: Medicare Other

## 2018-09-10 DIAGNOSIS — Z79899 Other long term (current) drug therapy: Secondary | ICD-10-CM | POA: Diagnosis not present

## 2018-09-10 DIAGNOSIS — E119 Type 2 diabetes mellitus without complications: Secondary | ICD-10-CM | POA: Insufficient documentation

## 2018-09-10 DIAGNOSIS — J101 Influenza due to other identified influenza virus with other respiratory manifestations: Secondary | ICD-10-CM | POA: Diagnosis not present

## 2018-09-10 DIAGNOSIS — Z7984 Long term (current) use of oral hypoglycemic drugs: Secondary | ICD-10-CM | POA: Insufficient documentation

## 2018-09-10 DIAGNOSIS — R05 Cough: Secondary | ICD-10-CM | POA: Diagnosis present

## 2018-09-10 LAB — INFLUENZA PANEL BY PCR (TYPE A & B)
Influenza A By PCR: POSITIVE — AB
Influenza B By PCR: NEGATIVE

## 2018-09-10 NOTE — ED Notes (Signed)
Pt returned from X Ray.

## 2018-09-10 NOTE — ED Triage Notes (Signed)
Pt sent by group home requesting a flu test, pt having fever today, congestion and body aches

## 2018-09-10 NOTE — ED Provider Notes (Signed)
[facility] EMERGENCY DEPARTMENT Provider Note   CSN: [id number] Arrival date & time: 09/10/18  1909     History   Chief Complaint Chief Complaint  Patient presents with  . Influenza    HPI Omar White is a 53 y.o. male with a hx of insulin-dependent diabetes, glaucoma, high cholesterol, developmental delay, seizures presents to the Emergency Department from a group home with complaints of URI symptoms.  Patient is unable to provide any history.  Level 5 Caveat for developmental delay.  Discussed with Molly Maduro.  [place].  He reports that several of the nurses stated that Renae Fickle had complaints of being achy, with headache, cough, and ST starting this morning.  Patient had a measured fever of 102 at the group home this afternoon.  Molly Maduro reports that several of the group home members have had influenza.  He reports that the patient was sent here for testing and treatment for influenza.  Molly Maduro reports that the nurses did not report any SOB, vomiting, altered mental status, syncope, ureters or other symptoms.    The history is provided by the patient and medical records. No language interpreter was used.    Past Medical History:  Diagnosis Date  . Diabetes mellitus   . Glaucoma   . High cholesterol   . MR (mental retardation)   . Seizures (HCC)     There are no active problems to display for this patient.   History reviewed. No pertinent surgical history.      Home Medications    Prior to Admission medications   Medication Sig Start Date End Date Taking? Authorizing Provider  amphetamine-dextroamphetamine (ADDERALL XR) 20 MG 24 hr capsule Take 20 mg daily by mouth.    [provider]  ARIPiprazole (ABILIFY) 15 MG tablet Take 15 mg by mouth daily. Daily in morning    [provider]  benztropine (COGENTIN) 1 MG tablet Take 1 mg by mouth 2 (two) times daily.    [provider]  clotrimazole (LOTRIMIN) 1 % cream  Apply 1 application topically 2 (two) times daily.    [provider]  docusate sodium (DOCQLACE) 100 MG capsule Take 100 mg by mouth 2 (two) times daily.    [provider]  Emollient (LUBRISKIN EX) Apply topically.    [provider]  guaiFENesin (ROBITUSSIN) 100 MG/5ML liquid Take 300 mg by mouth 3 (three) times daily as needed. For cough     [provider]  ketoconazole (NIZORAL) 2 % cream Apply 1 application topically daily.    [provider]  lactulose (CHRONULAC) 10 GM/15ML solution Take 10 g by mouth daily.      [provider]  lamoTRIgine (LAMICTAL) 100 MG tablet Take 100 mg by mouth 2 (two) times daily.     [provider]  levOCARNitine (CARNITOR) 330 MG tablet Take 330 mg by mouth 3 (three) times daily.    [provider]  lisinopril (PRINIVIL,ZESTRIL) 2.5 MG tablet Take 2.5 mg by mouth daily.     [provider]  LORazepam (ATIVAN) 1 MG tablet Take 1 mg by mouth daily.      [provider]  metFORMIN (GLUCOPHAGE) 500 MG tablet Take 500 mg by mouth 2 (two) times daily with a meal.      [provider]  mupirocin ointment (BACTROBAN) 2 % Place 1 application into the nose 2 (two) times daily.    [provider]  oseltamivir (TAMIFLU) 75 MG capsule Take  1 capsule (75 mg total) by mouth every 12 (twelve) hours. 09/11/18   Ehtan Delfavero, Dahlia Client, PA-C  propranolol (INDERAL) 60 MG tablet Take 60 mg by mouth daily. mornings    [provider]  propranolol (INDERAL) 80 MG tablet Take 80 mg by mouth at bedtime.     [provider]  risperiDONE (RISPERDAL) 2 MG tablet Take 2 mg by mouth 2 (two) times daily.      [provider]  simvastatin (ZOCOR) 20 MG tablet Take 20 mg by mouth daily.    [provider]    Family History No family history on file.  Social History Social History   Tobacco Use  . Smoking status: Never Smoker  . Smokeless  tobacco: Never Used  Substance Use Topics  . Alcohol use: No    Frequency: Never  . Drug use: No     Allergies   Shellfish allergy and Tegretol [carbamazepine]   Review of Systems Review of Systems  Unable to perform ROS: Other     Physical Exam Updated Vital Signs BP 132/81   Pulse 95   Temp 99.3 F (37.4 C) (Oral)   Resp 20   SpO2 100%   Physical Exam Vitals signs and nursing note reviewed.  Constitutional:      General: He is not in acute distress.    Appearance: He is well-developed. He is not diaphoretic.  HENT:     Head: Normocephalic and atraumatic.     Right Ear: Tympanic membrane, ear canal and external ear normal.     Left Ear: Tympanic membrane, ear canal and external ear normal.     Nose: Mucosal edema and rhinorrhea present.     Right Sinus: No maxillary sinus tenderness or frontal sinus tenderness.     Left Sinus: No maxillary sinus tenderness or frontal sinus tenderness.     Mouth/Throat:     Mouth: Mucous membranes are not pale and not cyanotic.     Pharynx: Uvula midline. No oropharyngeal exudate or posterior oropharyngeal erythema.     Tonsils: No tonsillar abscesses.  Eyes:     Conjunctiva/sclera: Conjunctivae normal.     Pupils: Pupils are equal, round, and reactive to light.  Neck:     Musculoskeletal: Full passive range of motion without pain and normal range of motion.  Cardiovascular:     Rate and Rhythm: Normal rate.  Pulmonary:     Effort: Pulmonary effort is normal.     Breath sounds: No stridor. Rhonchi ( throughout) present.  Abdominal:     Palpations: Abdomen is soft.     Tenderness: There is no abdominal tenderness.  Musculoskeletal: Normal range of motion.  Lymphadenopathy:     Cervical: No cervical adenopathy.  Skin:    General: Skin is warm and dry.     Findings: No rash.  Neurological:     Mental Status: He is alert.      ED Treatments / Results  Labs (all labs ordered are listed, but only abnormal results are  displayed) Labs Reviewed  INFLUENZA PANEL BY PCR (TYPE A & B) - Abnormal; Notable for the following components:      Result Value   Influenza A By PCR POSITIVE (*)    All other components within normal limits    Radiology Dg Chest 2 View  Result Date: 09/11/2018 CLINICAL DATA:  Fever with congestion and body aches. EXAM: CHEST - 2 VIEW COMPARISON:  09/10/2018 FINDINGS: Lungs are adequately inflated without focal airspace consolidation or  effusion. Cardiomediastinal silhouette and remainder of the exam is unchanged. IMPRESSION: No active cardiopulmonary disease. Electronically Signed   By: Elberta Fortisaniel  Boyle M.D.   On: 09/11/2018 01:12    Procedures Procedures (including critical care time)  Medications Ordered in ED Medications - No data to display   Initial Impression / Assessment and Plan / ED Course  I have reviewed the triage vital signs and the nursing notes.  Pertinent labs & imaging results that were available during my care of the patient were reviewed by me and considered in my medical decision making (see chart for details).  Clinical Course as of Sep 12 115  Fri Sep 10, 2018  2345 Patient positive for influenza A.  Will start Tamiflu.  Influenza A By PCR(!): POSITIVE [HM]    Clinical Course User Index [HM] Ilamae Geng, Dahlia ClientHannah, PA-C    Patient with symptoms consistent with influenza.  Vitals are stable, fever reported.  No signs of dehydration, tolerating PO's.  Lungs are coarse and slightly diminished.  CXR without evidence of pneumonia, pulmonary edema or pnuemothorax.  I personally evaluated these images.  Discussed the cost versus benefit of Tamiflu discussed with Molly Maduroobert who wishes for treatment with Tamiflu.  Patient will be discharged with instructions to orally hydrate, rest, and use over-the-counter medications such as anti-inflammatories ibuprofen and Aleve for muscle aches and Tylenol for fever.  Pt's group home representative at bedside reports understanding.     Final Clinical Impressions(s) / ED Diagnoses   Final diagnoses:  Influenza A    ED Discharge Orders         Ordered    oseltamivir (TAMIFLU) 75 MG capsule  Every 12 hours     09/11/18 0058           Jasa Dundon, Dahlia ClientHannah, PA-C 09/11/18 0117    Little, Ambrose Finlandachel Morgan, MD 09/12/18 [phone]

## 2018-09-10 NOTE — ED Notes (Addendum)
Pt transported to XRay 

## 2018-09-11 MED ORDER — OSELTAMIVIR PHOSPHATE 75 MG PO CAPS: 75.0000 mg | ORAL_CAPSULE | Freq: Two times a day (BID) | ORAL | 0 refills | Status: AC

## 2018-09-11 NOTE — Discharge Instructions (Signed)
1. Medications: Tamiflu, alternate tylenol and ibuprofen for fever control, usual home medications 2. Treatment: rest, drink plenty of fluids,  3. Follow Up: Please followup with your primary doctor in 2 days for discussion of your diagnoses and further evaluation after today's visit; if you do not have a primary care doctor use the resource guide provided to find one; Please return to the ER for syncope, difficulty breathing, persistent high fevers, intractable vomiting or other concerns

## 2019-03-14 ENCOUNTER — Other Ambulatory Visit: Payer: Self-pay

## 2019-03-14 ENCOUNTER — Encounter (HOSPITAL_COMMUNITY): Payer: Self-pay

## 2019-03-14 ENCOUNTER — Emergency Department (HOSPITAL_COMMUNITY)
Admission: EM | Admit: 2019-03-14 | Discharge: 2019-03-14 | Disposition: A | Discharge status: Home / Self Care | Payer: Medicare Other | Attending: Emergency Medicine | Admitting: Emergency Medicine

## 2019-03-14 DIAGNOSIS — E119 Type 2 diabetes mellitus without complications: Secondary | ICD-10-CM | POA: Insufficient documentation

## 2019-03-14 DIAGNOSIS — Z79899 Other long term (current) drug therapy: Secondary | ICD-10-CM | POA: Diagnosis not present

## 2019-03-14 DIAGNOSIS — Y69 Unspecified misadventure during surgical and medical care: Secondary | ICD-10-CM | POA: Diagnosis not present

## 2019-03-14 DIAGNOSIS — N289 Disorder of kidney and ureter, unspecified: Secondary | ICD-10-CM | POA: Diagnosis not present

## 2019-03-14 DIAGNOSIS — T887XXA Unspecified adverse effect of drug or medicament, initial encounter: Secondary | ICD-10-CM | POA: Diagnosis present

## 2019-03-14 HISTORY — DX: Attention-deficit hyperactivity disorder, unspecified type: F90.9

## 2019-03-14 HISTORY — DX: Cholecystitis, unspecified: K81.9

## 2019-03-14 HISTORY — DX: Unspecified cataract: H26.9

## 2019-03-14 HISTORY — DX: Unspecified glaucoma: H40.9

## 2019-03-14 LAB — BASIC METABOLIC PANEL
Anion gap: 10 (ref 5–15)
BUN: 26 mg/dL — ABNORMAL HIGH (ref 6–20)
CO2: 26 mmol/L (ref 22–32)
Calcium: 10.2 mg/dL (ref 8.9–10.3)
Chloride: 102 mmol/L (ref 98–111)
Creatinine, Ser: 1.83 mg/dL — ABNORMAL HIGH (ref 0.61–1.24)
GFR calc Af Amer: 47 mL/min — ABNORMAL LOW (ref >60)
GFR calc non Af Amer: 41 mL/min — ABNORMAL LOW (ref >60)
Glucose, Bld: 91 mg/dL (ref 70–99)
Potassium: 4.6 mmol/L (ref 3.5–5.1)
Sodium: 138 mmol/L (ref 135–145)

## 2019-03-14 LAB — CBC WITH DIFFERENTIAL/PLATELET
Abs Immature Granulocytes: 0.01 10*3/uL (ref 0.00–0.07)
Basophils Absolute: 0 10*3/uL (ref 0.0–0.1)
Basophils Relative: 0 %
Eosinophils Absolute: 0.2 10*3/uL (ref 0.0–0.5)
Eosinophils Relative: 2 %
HCT: 35.1 % — ABNORMAL LOW (ref 39.0–52.0)
Hemoglobin: 11.4 g/dL — ABNORMAL LOW (ref 13.0–17.0)
Immature Granulocytes: 0 %
Lymphocytes Relative: 20 %
Lymphs Abs: 1.5 10*3/uL (ref 0.7–4.0)
MCH: 31.8 pg (ref 26.0–34.0)
MCHC: 32.5 g/dL (ref 30.0–36.0)
MCV: 97.8 fL (ref 80.0–100.0)
Monocytes Absolute: 0.9 10*3/uL (ref 0.1–1.0)
Monocytes Relative: 12 %
Neutro Abs: 4.8 10*3/uL (ref 1.7–7.7)
Neutrophils Relative %: 66 %
Platelets: 158 10*3/uL (ref 150–400)
RBC: 3.59 MIL/uL — ABNORMAL LOW (ref 4.22–5.81)
RDW: 13 % (ref 11.5–15.5)
WBC: 7.4 10*3/uL (ref 4.0–10.5)
nRBC: 0 % (ref 0.0–0.2)

## 2019-03-14 MED ORDER — SODIUM CHLORIDE 0.9 % IV SOLN
INTRAVENOUS | Status: DC
  Administered 2019-03-14: 20:00:00 via INTRAVENOUS

## 2019-03-14 MED ORDER — SODIUM CHLORIDE 0.9 % IV BOLUS
1000.0000 mL | Freq: Once | INTRAVENOUS | Status: AC
  Administered 2019-03-14: 19:00:00 1000 mL via INTRAVENOUS

## 2019-03-14 NOTE — ED Provider Notes (Signed)
[facility]-EMERGENCY DEPT Provider Note   CSN: [id number] Arrival date & time: 03/14/19  1640     History   Chief Complaint Chief Complaint  Patient presents with  . Check Up    HPI Omar White is a 53 y.o. male.     53 year old male with history of MR presents from group home with concern for being overmedicated with his blood pressure medication.  Patient himself has no complaints here.  Blood pressure here is stable.  He has no complaints at this time     Past Medical History:  Diagnosis Date  . ADHD   . Cataract   . Cholecystitis   . Diabetes mellitus   . Glaucoma   . Glaucoma   . High cholesterol   . MR (mental retardation)   . Seizures (HCC)     There are no active problems to display for this patient.   History reviewed. No pertinent surgical history.      Home Medications    Prior to Admission medications   Medication Sig Start Date End Date Taking? Authorizing Provider  amphetamine-dextroamphetamine (ADDERALL XR) 20 MG 24 hr capsule Take 20 mg daily by mouth.    [provider]  ARIPiprazole (ABILIFY) 15 MG tablet Take 15 mg by mouth daily. Daily in morning    [provider]  benztropine (COGENTIN) 1 MG tablet Take 1 mg by mouth 2 (two) times daily.    [provider]  clotrimazole (LOTRIMIN) 1 % cream Apply 1 application topically 2 (two) times daily.    [provider]  docusate sodium (DOCQLACE) 100 MG capsule Take 100 mg by mouth 2 (two) times daily.    [provider]  Emollient (LUBRISKIN EX) Apply topically.    [provider]  guaiFENesin (ROBITUSSIN) 100 MG/5ML liquid Take 300 mg by mouth 3 (three) times daily as needed. For cough     [provider]  ketoconazole (NIZORAL) 2 % cream Apply 1 application topically daily.    [provider]  lactulose (CHRONULAC) 10 GM/15ML solution Take 10 g by mouth daily.      [provider]   lamoTRIgine (LAMICTAL) 100 MG tablet Take 100 mg by mouth 2 (two) times daily.     [provider]  levOCARNitine (CARNITOR) 330 MG tablet Take 330 mg by mouth 3 (three) times daily.    [provider]  lisinopril (PRINIVIL,ZESTRIL) 2.5 MG tablet Take 2.5 mg by mouth daily.     [provider]  LORazepam (ATIVAN) 1 MG tablet Take 1 mg by mouth daily.      [provider]  metFORMIN (GLUCOPHAGE) 500 MG tablet Take 500 mg by mouth 2 (two) times daily with a meal.      [provider]  mupirocin ointment (BACTROBAN) 2 % Place 1 application into the nose 2 (two) times daily.    [provider]  oseltamivir (TAMIFLU) 75 MG capsule Take 1 capsule (75 mg total) by mouth every 12 (twelve) hours. 09/11/18   Muthersbaugh, Dahlia ClientHannah, PA-C  propranolol (INDERAL) 60 MG tablet Take 60 mg by mouth daily. mornings    [provider]  propranolol (INDERAL) 80 MG tablet Take 80 mg by mouth at bedtime.     [provider]  risperiDONE (RISPERDAL) 2 MG tablet Take 2 mg by mouth 2 (two) times daily.      [provider]  simvastatin (ZOCOR) 20 MG tablet Take 20 mg by mouth daily.  [provider]    Family History History reviewed. No pertinent family history.  Social History Social History   Tobacco Use  . Smoking status: Never Smoker  . Smokeless tobacco: Never Used  Substance Use Topics  . Alcohol use: No    Frequency: Never  . Drug use: No     Allergies   Shellfish allergy and Tegretol [carbamazepine]   Review of Systems Review of Systems  Unable to perform ROS: Other     Physical Exam Updated Vital Signs BP 113/74 (BP Location: Right Arm)   Pulse 64   Temp 98.8 F (37.1 C) (Oral)   Resp 18   Ht 1.626 m (5\' 4" )   Wt 61.2 kg   SpO2 100%   BMI 23.17 kg/m   Physical Exam Vitals signs and nursing note reviewed.  Constitutional:      General: He is not in acute distress.    Appearance: Normal  appearance. He is well-developed. He is not toxic-appearing.  HENT:     Head: Normocephalic and atraumatic.  Eyes:     General: Lids are normal.     Conjunctiva/sclera: Conjunctivae normal.     Pupils: Pupils are equal, round, and reactive to light.  Neck:     Musculoskeletal: Normal range of motion and neck supple.     Thyroid: No thyroid mass.     Trachea: No tracheal deviation.  Cardiovascular:     Rate and Rhythm: Normal rate and regular rhythm.     Heart sounds: Normal heart sounds. No murmur. No gallop.   Pulmonary:     Effort: Pulmonary effort is normal. No respiratory distress.     Breath sounds: Normal breath sounds. No stridor. No decreased breath sounds, wheezing, rhonchi or rales.  Abdominal:     General: Bowel sounds are normal. There is no distension.     Palpations: Abdomen is soft.     Tenderness: There is no abdominal tenderness. There is no rebound.  Musculoskeletal: Normal range of motion.        General: No tenderness.  Skin:    General: Skin is warm and dry.     Findings: No abrasion or rash.  Neurological:     Mental Status: He is alert.     GCS: GCS eye subscore is 4. GCS verbal subscore is 5. GCS motor subscore is 6.     Cranial Nerves: No cranial nerve deficit.     Sensory: No sensory deficit.     Motor: No weakness or tremor.     Comments: Strength is 5 of 5 in all 4 extremities  Psychiatric:        Attention and Perception: Attention normal.      ED Treatments / Results  Labs (all labs ordered are listed, but only abnormal results are displayed) Labs Reviewed - No data to display  EKG None  Radiology No results found.  Procedures Procedures (including critical care time)  Medications Ordered in ED Medications - No data to display   Initial Impression / Assessment and Plan / ED Course  I have reviewed the triage vital signs and the nursing notes.  Pertinent labs & imaging results that were available during my care of the patient  were reviewed by me and considered in my medical decision making (see chart for details).        Patient mildly orthostatic here and given IV fluids.  Does have evidence of renal insufficiency.  Will need follow-up for this.  Blood pressure  has improved.  Final Clinical Impressions(s) / ED Diagnoses   Final diagnoses:  None    ED Discharge Orders    None       Lorre NickAllen, Lamiah Marmol, MD 03/14/19 2012

## 2019-03-14 NOTE — Discharge Instructions (Addendum)
The patient's creatinine today was 1.82.  This could be from dehydration.  He will need to have a repeat renal function studies within a week to recheck this.

## 2019-03-14 NOTE — ED Triage Notes (Signed)
Arrived by [facility] from [place] group home. Staff sent patient for "check up" to have labs drawn and evaluation of blood pressure medication that he takes. Staff is concerned that his BP is too low. EMS reports BP no lower than 100's/60's. Asymptomatic

## 2020-10-06 IMAGING — DX DG CHEST 2V
2 series · 2 of 2 positions shown · non-contrast
Comparison: 09/10/2018

CLINICAL DATA: Fever with congestion and body aches.

EXAM:
CHEST - 2 VIEW

[w chest pa]
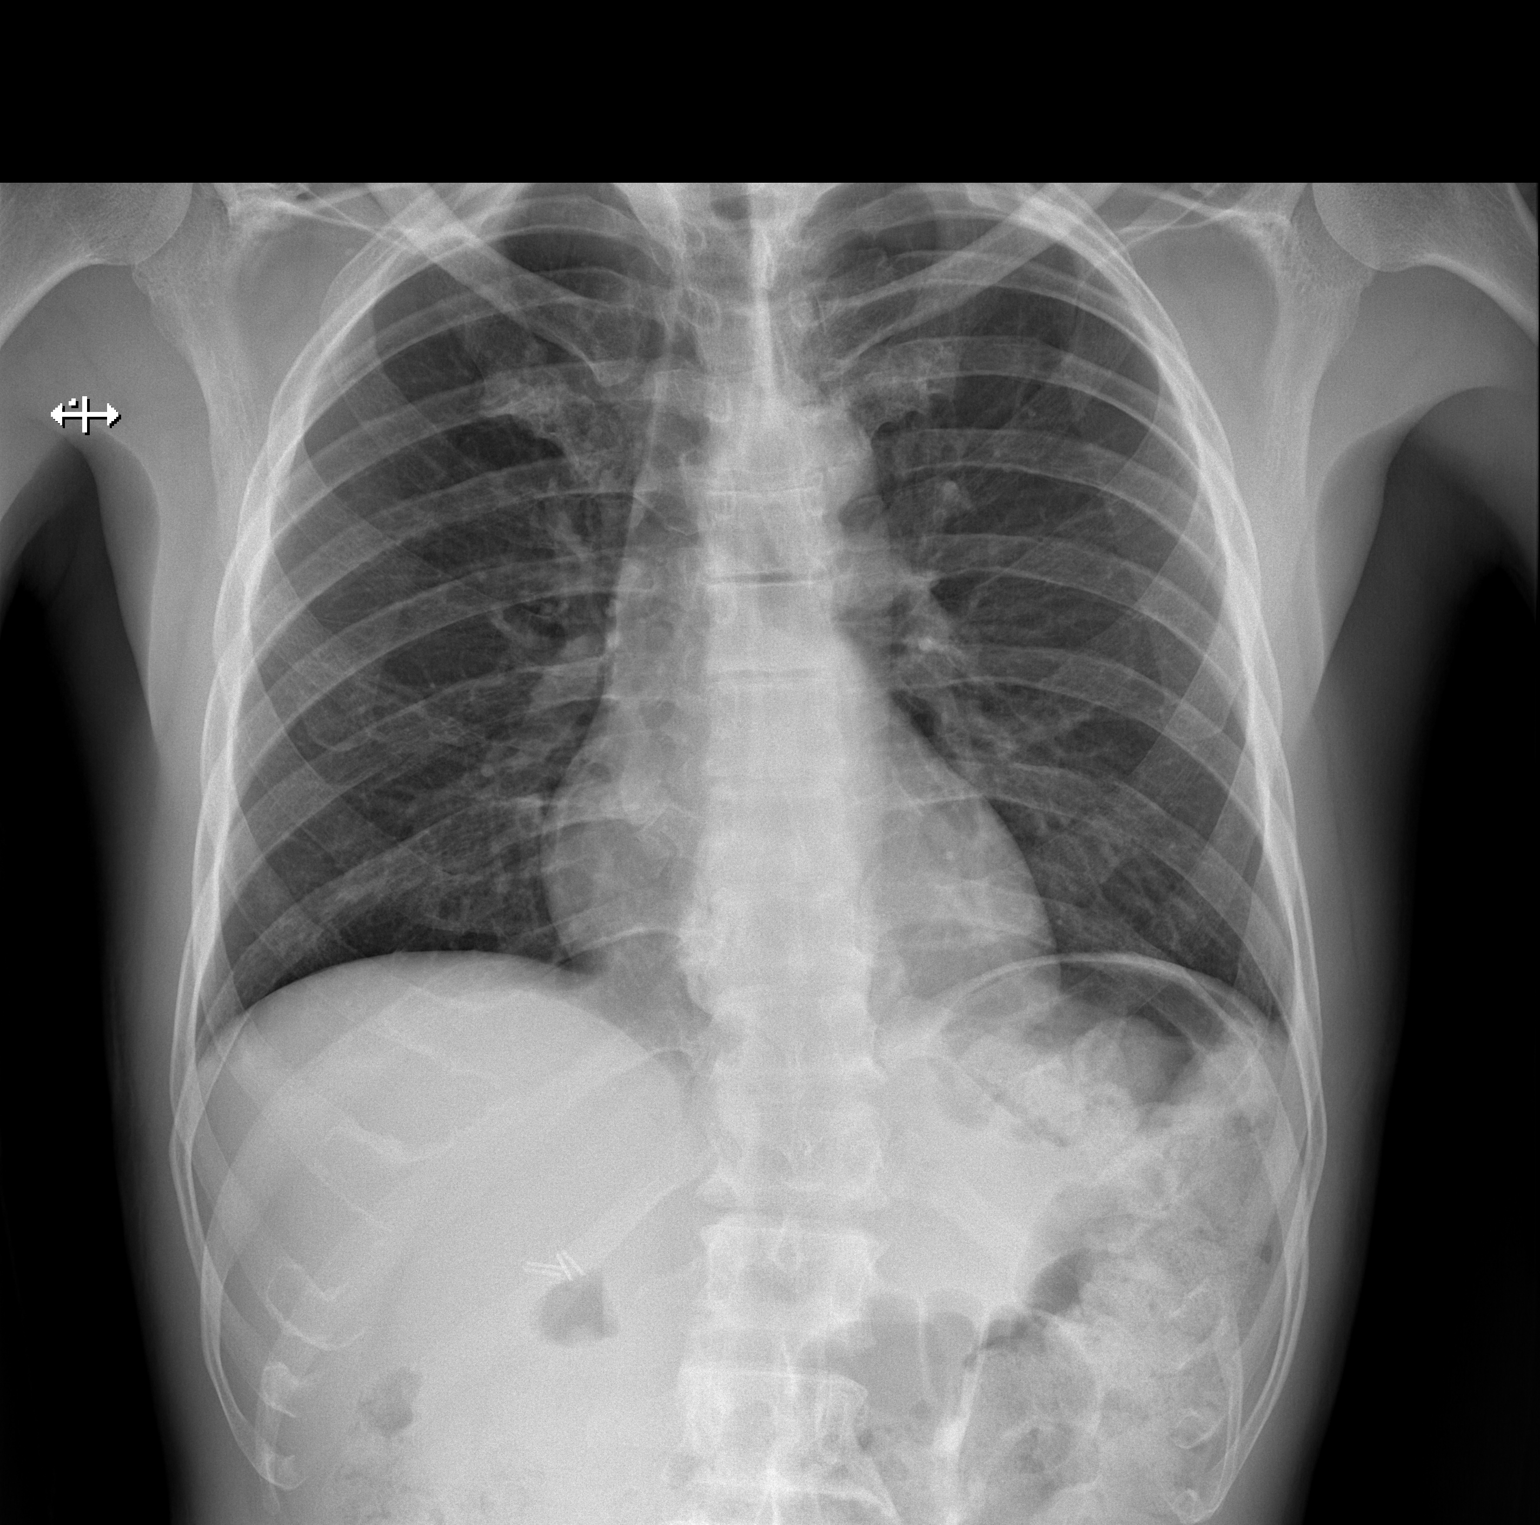

[w chest lat]
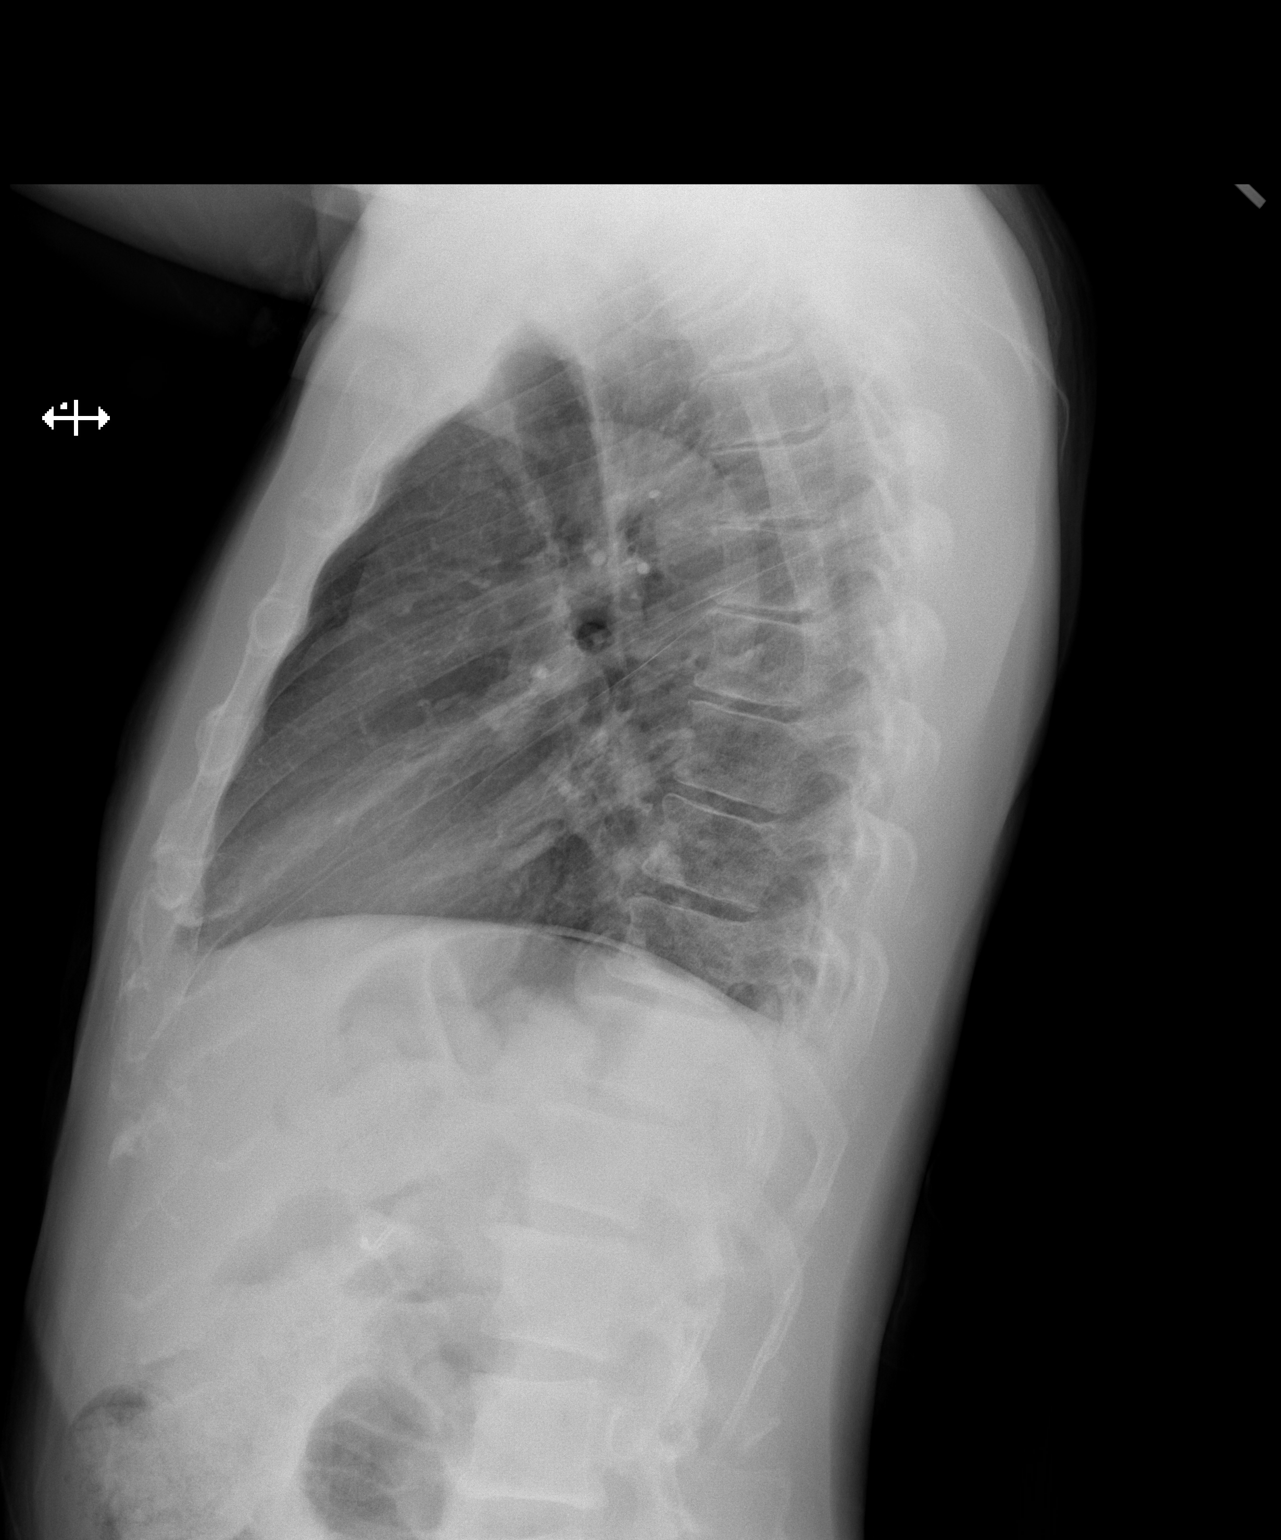

[2 of 2 positions shown; findings below may reference images not displayed]

FINDINGS: Lungs are adequately inflated without focal airspace consolidation
or effusion. Cardiomediastinal silhouette and remainder of the exam
is unchanged.
IMPRESSION: No active cardiopulmonary disease.

## 2020-10-16 DIAGNOSIS — E7800 Pure hypercholesterolemia, unspecified: Secondary | ICD-10-CM | POA: Insufficient documentation

## 2020-10-16 DIAGNOSIS — F79 Unspecified intellectual disabilities: Secondary | ICD-10-CM | POA: Insufficient documentation

## 2020-10-16 DIAGNOSIS — F909 Attention-deficit hyperactivity disorder, unspecified type: Secondary | ICD-10-CM | POA: Insufficient documentation

## 2020-10-18 DIAGNOSIS — E1169 Type 2 diabetes mellitus with other specified complication: Secondary | ICD-10-CM | POA: Insufficient documentation

## 2020-10-18 DIAGNOSIS — I10 Essential (primary) hypertension: Secondary | ICD-10-CM | POA: Insufficient documentation

## 2021-10-22 DIAGNOSIS — H4089 Other specified glaucoma: Secondary | ICD-10-CM | POA: Insufficient documentation

## 2021-10-22 DIAGNOSIS — J309 Allergic rhinitis, unspecified: Secondary | ICD-10-CM | POA: Insufficient documentation

## 2021-10-22 DIAGNOSIS — F22 Delusional disorders: Secondary | ICD-10-CM | POA: Insufficient documentation

## 2021-10-22 DIAGNOSIS — G259 Extrapyramidal and movement disorder, unspecified: Secondary | ICD-10-CM | POA: Insufficient documentation

## 2021-10-22 DIAGNOSIS — Q02 Microcephaly: Secondary | ICD-10-CM | POA: Insufficient documentation

## 2021-10-22 DIAGNOSIS — R569 Unspecified convulsions: Secondary | ICD-10-CM | POA: Insufficient documentation

## 2021-10-22 DIAGNOSIS — F6381 Intermittent explosive disorder: Secondary | ICD-10-CM | POA: Insufficient documentation

## 2021-10-22 DIAGNOSIS — K5909 Other constipation: Secondary | ICD-10-CM | POA: Insufficient documentation

## 2022-06-26 DIAGNOSIS — Z9841 Cataract extraction status, right eye: Secondary | ICD-10-CM | POA: Insufficient documentation

## 2022-07-31 ENCOUNTER — Telehealth: Payer: Self-pay | Admitting: *Deleted

## 2022-07-31 ENCOUNTER — Ambulatory Visit (AMBULATORY_SURGERY_CENTER): Payer: Medicare Other | Admitting: *Deleted

## 2022-07-31 VITALS — Wt 121.0 lb

## 2022-07-31 DIAGNOSIS — Z1211 Encounter for screening for malignant neoplasm of colon: Secondary | ICD-10-CM

## 2022-07-31 MED ORDER — PEG 3350-KCL-NA BICARB-NACL 420 G PO SOLR: 4000.0000 mL | Freq: Once | ORAL | 0 refills | Status: AC

## 2022-07-31 NOTE — Telephone Encounter (Signed)
07/31/22 Called # listed Male answered and stated I had wrong #  Second attempt with # listed [phone] as before. Male answered and when asked for pt by name she hung up on RN. Unable to find another # for pt.

## 2022-07-31 NOTE — Progress Notes (Signed)
No egg or soy allergy known to patient  No issues known to pt with past sedation with any surgeries or procedures Patient denies ever being told they had issues or difficulty with intubation  No FH of Malignant Hyperthermia Pt is not on diet pills Pt is not on  home 02  Pt is not on blood thinners  Pt has  issues with constipation takes meds for issue Pt is not on dialysis Pt denies any upcoming cardiac testing Pt encouraged to use to use Singlecare or Goodrx to reduce cost  Patient's chart reviewed by Osvaldo Angst CNRA prior to previsit and patient appropriate for the [facility].  Previsit completed and red dot placed by patient's name on their procedure day (on provider's schedule).  . Pt is nonverbal Gait steady but back in crooked Care giver who came with pt did not have any information about pt besides paper sent by pts RN's  Informned several times during instructions that the person coming with pt has to stay in the building while procedure is being done. She stated she understood.  MD sent FYI on pt being nonverbal and has hx of sxsplosive outbursts. Care giver stated she has not seen any in  " awhile" Seizure activity caregiver stated has been " awhile" Unable to obtain height of pt due to pt not willing to do so

## 2022-08-07 ENCOUNTER — Telehealth: Payer: Self-pay | Admitting: Gastroenterology

## 2022-08-07 DIAGNOSIS — Z1211 Encounter for screening for malignant neoplasm of colon: Secondary | ICD-10-CM

## 2022-08-07 MED ORDER — PEG 3350-KCL-NA BICARB-NACL 420 G PO SOLR: 4000.0000 mL | Freq: Once | ORAL | 0 refills | Status: AC

## 2022-08-07 NOTE — Telephone Encounter (Signed)
Omar White from [place], requesting prep medication be sent to the [facility] on [address]. Thank you.

## 2022-08-07 NOTE — Telephone Encounter (Signed)
Attempted to reach

## 2022-08-07 NOTE — Telephone Encounter (Signed)
Spoke with Omar White made aware that rx. Was sent to pharmacy she said "ok we can pick it up there."

## 2022-08-25 ENCOUNTER — Ambulatory Visit (AMBULATORY_SURGERY_CENTER): Payer: Medicare Other | Admitting: Gastroenterology

## 2022-08-25 ENCOUNTER — Telehealth: Payer: Self-pay

## 2022-08-25 ENCOUNTER — Encounter: Payer: Self-pay | Admitting: Gastroenterology

## 2022-08-25 VITALS — BP 110/63 | HR 50 | Temp 97.7°F | Resp 15

## 2022-08-25 DIAGNOSIS — Z538 Procedure and treatment not carried out for other reasons: Secondary | ICD-10-CM | POA: Diagnosis not present

## 2022-08-25 DIAGNOSIS — Z1211 Encounter for screening for malignant neoplasm of colon: Secondary | ICD-10-CM | POA: Diagnosis not present

## 2022-08-25 MED ORDER — SODIUM CHLORIDE 0.9 % IV SOLN: 500.0000 mL | Freq: Once | INTRAVENOUS | Status: AC

## 2022-08-25 NOTE — Patient Instructions (Signed)
YOU HAD AN ENDOSCOPIC PROCEDURE TODAY AT THE [facility]:   Refer to the procedure report that was given to you for any specific questions about what was found during the examination.  If the procedure report does not answer your questions, please call your gastroenterologist to clarify.  If you requested that your care partner not be given the details of your procedure findings, then the procedure report has been included in a sealed envelope for you to review at your convenience later.  YOU SHOULD EXPECT: Some feelings of bloating in the abdomen. Passage of more gas than usual.  Walking can help get rid of the air that was put into your GI tract during the procedure and reduce the bloating. If you had a lower endoscopy (such as a colonoscopy or flexible sigmoidoscopy) you may notice spotting of blood in your stool or on the toilet paper. If you underwent a bowel prep for your procedure, you may not have a normal bowel movement for a few days.  Please Note:  You might notice some irritation and congestion in your nose or some drainage.  This is from the oxygen used during your procedure.  There is no need for concern and it should clear up in a day or so.  SYMPTOMS TO REPORT IMMEDIATELY:  Following lower endoscopy (colonoscopy or flexible sigmoidoscopy):  Excessive amounts of blood in the stool  Significant tenderness or worsening of abdominal pains  Swelling of the abdomen that is new, acute  Fever of 100F or higher  For urgent or emergent issues, a gastroenterologist can be reached at any hour by calling [phone]. Do not use MyChart messaging for urgent concerns.    DIET:  We do recommend a small meal at first, but then you may proceed to your regular diet.  Drink plenty of fluids but you should avoid alcoholic beverages for 24 hours.  ACTIVITY:  You should plan to take it easy for the rest of today and you should NOT DRIVE or use heavy machinery until tomorrow (because of  the sedation medicines used during the test).    FOLLOW UP: Our staff will call the number listed on your records the next business day following your procedure.  We will call around 7:15- 8:00 am to check on you and address any questions or concerns that you may have regarding the information given to you following your procedure. If we do not reach you, we will leave a message.     If any biopsies were taken you will be contacted by phone or by letter within the next 1-3 weeks.  Please call us at [phone] if you have not heard about the biopsies in 3 weeks.    SIGNATURES/CONFIDENTIALITY: You and/or your care partner have signed paperwork which will be entered into your electronic medical record.  These signatures attest to the fact that that the information above on your After Visit Summary has been reviewed and is understood.  Full responsibility of the confidentiality of this discharge information lies with you and/or your care-partner.  

## 2022-08-25 NOTE — Progress Notes (Signed)
Report to pacu rn. Vss. Care resumed by rn. 

## 2022-08-25 NOTE — Telephone Encounter (Signed)
Omar White returned call and was informed that he can provide approval for procedure today. Omar White wanted to know when results would be available. I informed Omar White that if biopsies are taken it will be about a week before we get those results back as they are sent to an outside lab. Omar White states that he will obtain report from group home once available. Omar White had no other concerns at the end of the call.

## 2022-08-25 NOTE — Progress Notes (Signed)
Pt's states no medical or surgical changes since previsit or office visit. Obtain information from group home.

## 2022-08-25 NOTE — Progress Notes (Signed)
[facility] History and Physical   Primary Care Physician:  Pcp, No   Reason for Procedure:   Colon cancer screening  Plan:    Screening colonoscopy     HPI: Omar White is a 57 y.o. male undergoing initial average risk screening colonoscopy.  He has no family history of colon cancer and no chronic GI symptoms.    Past Medical History:  Diagnosis Date   ADHD    Cataract    Cholecystitis    Diabetes mellitus    Glaucoma    Glaucoma    High cholesterol    MR (mental retardation)    Seizures (HCC)     No past surgical history on file.  Prior to Admission medications   Medication Sig Start Date End Date Taking? Authorizing Provider  benztropine (COGENTIN) 1 MG tablet Take 1 mg by mouth 2 (two) times daily.   Yes [provider]  chlorhexidine (PERIDEX) 0.12 % solution Brush on 5 mL of solution to teeth and gums with a toothbrush after evening mouth care. Spit out excess and do not rinse.   Yes [provider]  Diaper Rash Products (BALMEX MULTI-PURPOSE) OINT Apply to affected area up to 4 times daily for skin irritations/rash/chapped skin.   Yes [provider]  docusate sodium (DOCQLACE) 100 MG capsule Take 100 mg by mouth 2 (two) times daily.   Yes [provider]  ketoconazole (NIZORAL) 2 % cream Apply 1 application topically daily.   Yes [provider]  ketorolac (ACULAR) 0.5 % ophthalmic solution Place 1 drop into the right eye 4 times daily. 06/27/22  Yes [provider]  lamoTRIgine (LAMICTAL) 100 MG tablet Take 100-200 mg by mouth 2 (two) times daily. Take 2 tablets (200 mg) in the morning and Take 1 tablet (100 mg) in the afternoon   Yes [provider]  metFORMIN (GLUCOPHAGE) 500 MG tablet Take 500 mg by mouth 2 (two) times daily with a meal.     Yes [provider]  mupirocin ointment (BACTROBAN) 2 % Place 1 application into the nose 2 (two) times daily.   Yes [provider]  propranolol (INDERAL) 60 MG tablet Take 60 mg by mouth daily. mornings   Yes [provider]  propranolol (INDERAL) 80 MG tablet Take 80 mg by mouth at bedtime.    Yes [provider]  rosuvastatin (CRESTOR) 20 MG tablet Take 20 mg by mouth every evening.   Yes [provider]  Sodium Fluoride (SODIUM FLUORIDE 5000 PPM) 1.1 % PSTE Brush on teeth with a toothbrush after evening mouth care and spit out excess. Do not rinse.   Yes [provider]  clotrimazole (LOTRIMIN) 1 % cream Apply 1 application topically 2 (two) times daily. Patient not taking: Reported on 07/31/2022    [provider]  diphenhydrAMINE (BENADRYL) 12.5 MG/5ML liquid Take by mouth 4 (four) times daily as needed.    [provider]  guaiFENesin (ROBITUSSIN) 100 MG/5ML liquid Take 300 mg by mouth 3 (three) times daily as needed. For cough     [provider]  hydrocortisone cream 1 % Apply topically to affected area for itching/bug bites as needed.    [provider]  hypromellose (GENTEAL) 0.3 % GEL ophthalmic ointment  09/05/21   [provider]  ibuprofen (ADVIL) 200 MG tablet Take by mouth.    [provider]  levOCARNitine (CARNITOR) 330 MG tablet Take 330 mg by mouth 2 (two) times daily.  Patient not taking: Reported on 07/31/2022    [provider]  lisinopril (PRINIVIL,ZESTRIL) 2.5 MG tablet Take 2.5 mg by mouth daily.  Patient not taking: Reported on 07/31/2022    [provider]  loperamide (IMODIUM A-D) 2 MG tablet Take 2 tablets (4 mg) by mouth for initial dose of diarrhea. Then take 1 tablet (2 mg) as needed for each loose stool afterwards. Do not exceed 8 tablets in 24 hours.    [provider]  magnesium hydroxide (MILK OF MAGNESIA) 400 MG/5ML suspension Take by mouth daily as needed for mild constipation.    [provider]  neomycin-bacitracin-polymyxin [phone] OINT Cleanse area  with soap and water and apply ointment 2 times daily x 3 days for abrasions and superficial lacerations. Cover with bandaid/dressing as needed.    [provider]  oseltamivir (TAMIFLU) 75 MG capsule Take 1 capsule (75 mg total) by mouth every 12 (twelve) hours. Patient not taking: Reported on 03/14/2019 09/11/18   Muthersbaugh, Jarrett Soho, PA-C  promethazine (PHENERGAN) 12.5 MG suppository Place 12.5 mg rectally every 6 (six) hours as needed for nausea or vomiting.    [provider]  promethazine (PHENERGAN) 25 MG tablet Take 25 mg by mouth every 4-6 hours as needed for vomiting 2 or more times in 4 hours.    [provider]  Vitamins A & D (VITAMIN A & D) ointment Apply to affected area 3 times daily as needed for skin irritations/rash/chapped skin.    [provider]    Current Outpatient Medications  Medication Sig Dispense Refill   benztropine (COGENTIN) 1 MG tablet Take 1 mg by mouth 2 (two) times daily.     chlorhexidine (PERIDEX) 0.12 % solution Brush on 5 mL of solution to teeth and gums with a toothbrush after evening mouth care. Spit out excess and do not rinse.     Diaper Rash Products (BALMEX MULTI-PURPOSE) OINT Apply to affected area up to 4 times daily for skin irritations/rash/chapped skin.     docusate sodium (DOCQLACE) 100 MG capsule Take 100 mg by mouth 2 (two) times daily.     ketoconazole (NIZORAL) 2 % cream Apply 1 application topically daily.     ketorolac (ACULAR) 0.5 % ophthalmic solution Place 1 drop into the right eye 4 times daily.     lamoTRIgine (LAMICTAL) 100 MG tablet Take 100-200 mg by mouth 2 (two) times daily. Take 2 tablets (200 mg) in the morning and Take 1 tablet (100 mg) in the afternoon     metFORMIN (GLUCOPHAGE) 500 MG tablet Take 500 mg by mouth 2 (two) times daily with a meal.       mupirocin ointment (BACTROBAN) 2 % Place 1 application into the nose 2 (two) times daily.     propranolol (INDERAL) 60 MG tablet Take 60 mg by  mouth daily. mornings     propranolol (INDERAL) 80 MG tablet Take 80 mg by mouth at bedtime.      rosuvastatin (CRESTOR) 20 MG tablet Take 20 mg by mouth every evening.     Sodium Fluoride (SODIUM FLUORIDE 5000 PPM) 1.1 % PSTE Brush on teeth with a toothbrush after evening mouth care and spit out excess. Do not rinse.     clotrimazole (LOTRIMIN) 1 % cream Apply 1 application topically 2 (two) times daily. (Patient not taking: Reported on 07/31/2022)     diphenhydrAMINE (BENADRYL) 12.5 MG/5ML liquid Take by mouth 4 (four) times daily as needed.     guaiFENesin (ROBITUSSIN) 100  MG/5ML liquid Take 300 mg by mouth 3 (three) times daily as needed. For cough      hydrocortisone cream 1 % Apply topically to affected area for itching/bug bites as needed.     hypromellose (GENTEAL) 0.3 % GEL ophthalmic ointment      ibuprofen (ADVIL) 200 MG tablet Take by mouth.     levOCARNitine (CARNITOR) 330 MG tablet Take 330 mg by mouth 2 (two) times daily.  (Patient not taking: Reported on 07/31/2022)     lisinopril (PRINIVIL,ZESTRIL) 2.5 MG tablet Take 2.5 mg by mouth daily.  (Patient not taking: Reported on 07/31/2022)     loperamide (IMODIUM A-D) 2 MG tablet Take 2 tablets (4 mg) by mouth for initial dose of diarrhea. Then take 1 tablet (2 mg) as needed for each loose stool afterwards. Do not exceed 8 tablets in 24 hours.     magnesium hydroxide (MILK OF MAGNESIA) 400 MG/5ML suspension Take by mouth daily as needed for mild constipation.     neomycin-bacitracin-polymyxin [phone] OINT Cleanse area with soap and water and apply ointment 2 times daily x 3 days for abrasions and superficial lacerations. Cover with bandaid/dressing as needed.     oseltamivir (TAMIFLU) 75 MG capsule Take 1 capsule (75 mg total) by mouth every 12 (twelve) hours. (Patient not taking: Reported on 03/14/2019) 10 capsule 0   promethazine (PHENERGAN) 12.5 MG suppository Place 12.5 mg rectally every 6 (six) hours as needed for nausea or vomiting.      promethazine (PHENERGAN) 25 MG tablet Take 25 mg by mouth every 4-6 hours as needed for vomiting 2 or more times in 4 hours.     Vitamins A & D (VITAMIN A & D) ointment Apply to affected area 3 times daily as needed for skin irritations/rash/chapped skin.     Current Facility-Administered Medications  Medication Dose Route Frequency Provider Last Rate Last Admin   0.9 %  sodium chloride infusion  500 mL Intravenous Once Daryel November, MD        Allergies as of 08/25/2022 - Review Complete 08/25/2022  Allergen Reaction Noted   Shellfish allergy  06/29/2011   Tegretol [carbamazepine]  06/13/2017    No family history on file.  Social History   Socioeconomic History   Marital status: Single    Spouse name: Not on file   Number of children: Not on file   Years of education: Not on file   Highest education level: Not on file  Occupational History   Not on file  Tobacco Use   Smoking status: Never   Smokeless tobacco: Never  Vaping Use   Vaping Use: Never used  Substance and Sexual Activity   Alcohol use: No   Drug use: No   Sexual activity: Never  Other Topics Concern   Not on file  Social History Narrative   Not on file   Social Determinants of Health   Financial Resource Strain: Not on file  Food Insecurity: Not on file  Transportation Needs: Not on file  Physical Activity: Not on file  Stress: Not on file  Social Connections: Not on file  Intimate Partner Violence: Not on file    Review of Systems:  All other review of systems negative except as mentioned in the HPI.  Physical Exam: Vital signs BP (!) 151/87   Temp 97.7 F (36.5 C)   Resp 14   SpO2 99%   General:   Alert,  Well-developed, well-nourished, pleasant and cooperative in NAD.  Able  to follow simple commands.  A&O to self. Airway:  Mallampati 2 Lungs:  Clear throughout to auscultation.   Heart:  Regular rate and rhythm; no murmurs, clicks, rubs,  or gallops. Abdomen:  Soft, nontender  and nondistended. Normal bowel sounds.   Neuro/Psych:  Normal mood and affect. A and O x 3   Jalana Moore E. Tomasa Rand, MD [facility]

## 2022-08-25 NOTE — Progress Notes (Signed)
Patient is mentally challenge. Answers yes to all questions.Recheck blood sugar 72. Group home stated patient liquid and clear but brown smears to underwear. M.D. notified.

## 2022-08-25 NOTE — Op Note (Signed)
[place] Patient Name: Omar White Procedure Date: 08/25/2022 2:07 PM MRN: [id number] Endoscopist: Nicki Reaper E. Candis Schatz , MD, [id number] Age: 57 Referring MD:  Date of Birth: 1966-07-22 Gender: Male Account #: [id number] Procedure:                Colonoscopy Indications:              Screening for colorectal malignant neoplasm, This                            is the patient's first colonoscopy Medicines:                Monitored Anesthesia Care Procedure:                Pre-Anesthesia Assessment:                           - Prior to the procedure, a History and Physical                            was performed, and patient medications and                            allergies were reviewed. The patient's tolerance of                            previous anesthesia was also reviewed. The risks                            and benefits of the procedure and the sedation                            options and risks were discussed with the patient.                            All questions were answered, and informed consent                            was obtained. Prior Anticoagulants: The patient has                            taken no anticoagulant or antiplatelet agents. ASA                            Grade Assessment: III - A patient with severe                            systemic disease. After reviewing the risks and                            benefits, the patient was deemed in satisfactory                            condition to undergo the procedure.  After obtaining informed consent, the colonoscope                            was passed under direct vision. Throughout the                            procedure, the patient's blood pressure, pulse, and                            oxygen saturations were monitored continuously. The                            Olympus CF-HQ190L [id number]) Colonoscope was                            introduced through the  anus with the intention of                            advancing to the cecum. The scope was advanced to                            the rectum before the procedure was aborted.                            Medications were given. The colonoscopy was aborted                            due to poor bowel prep with stool present. The                            bowel preparation used was GoLYTELY via split dose                            instruction. Scope In: 3:06:43 PM Scope Out: 3:08:14 PM Total Procedure Duration: 0 hours 1 minute 31 seconds  Findings:                 The perianal and digital rectal examinations were                            normal. Pertinent negatives include normal                            sphincter tone and no palpable rectal lesions.                           Extensive amounts of stool was found in the rectum                            and in the recto-sigmoid colon, precluding                            visualization. Complications:            No immediate complications. Estimated Blood Loss:  Estimated blood loss: none. Impression:               - The procedure was aborted due to poor bowel prep                            with stool present.                           - Extensive semiliquid stool in the rectum and in                            the recto-sigmoid colon, precluding visualization.                           - No specimens collected. Recommendation:           - Patient has a contact number available for                            emergencies. The signs and symptoms of potential                            delayed complications were discussed with the                            patient. Return to normal activities tomorrow.                            Written discharge instructions were provided to the                            patient.                           - Resume previous diet.                           - Continue present medications.                            - Recommend stool based colon cancer screening test                            such as Cologuard, given anticipated difficulties                            of adequate bowel prep. Omar White E. Candis Schatz, MD 08/25/2022 3:15:31 PM This report has been signed electronically.

## 2022-08-25 NOTE — Telephone Encounter (Signed)
Received a call from Mendes, patient's social worker at [employer]. Joe informed me that one of the Directors of DSS would have to approve invasive procedures such as colonoscopy. Joe states that a verbal conversation will be fine. Joe will have one of the Directors of DSS reach out to our office this morning regarding this as patient's procedure is scheduled for this afternoon. I informed Joe that the Director can ask to speak with me. Will await call.

## 2022-08-26 ENCOUNTER — Telehealth: Payer: Self-pay | Admitting: *Deleted

## 2022-08-26 NOTE — Telephone Encounter (Signed)
  Follow up Call-     08/25/2022    1:59 PM  Call back number  Post procedure Call Back phone  # [phone] Group Home  Permission to leave phone message Yes     Patient questions:   No message left at this time.  Number would not go  through.
